# Patient Record
Sex: Male | Born: 1935 | Race: White | Hispanic: No | Marital: Married | Smoking: Never smoker
Health system: Southern US, Community
[De-identification: ages and names within clinical notes are randomized; demographics above are authoritative.]

## PROBLEM LIST (undated history)

## (undated) DIAGNOSIS — N400 Enlarged prostate without lower urinary tract symptoms: Secondary | ICD-10-CM

## (undated) DIAGNOSIS — E785 Hyperlipidemia, unspecified: Secondary | ICD-10-CM

## (undated) DIAGNOSIS — I480 Paroxysmal atrial fibrillation: Secondary | ICD-10-CM

## (undated) DIAGNOSIS — I00 Rheumatic fever without heart involvement: Secondary | ICD-10-CM

## (undated) DIAGNOSIS — M7989 Other specified soft tissue disorders: Secondary | ICD-10-CM

## (undated) DIAGNOSIS — I10 Essential (primary) hypertension: Secondary | ICD-10-CM

## (undated) DIAGNOSIS — E119 Type 2 diabetes mellitus without complications: Secondary | ICD-10-CM

## (undated) DIAGNOSIS — M1990 Unspecified osteoarthritis, unspecified site: Secondary | ICD-10-CM

## (undated) DIAGNOSIS — I739 Peripheral vascular disease, unspecified: Secondary | ICD-10-CM

## (undated) DIAGNOSIS — I272 Pulmonary hypertension, unspecified: Secondary | ICD-10-CM

## (undated) HISTORY — DX: Rheumatic fever without heart involvement: I00

## (undated) HISTORY — DX: Essential (primary) hypertension: I10

## (undated) HISTORY — DX: Unspecified osteoarthritis, unspecified site: M19.90

## (undated) HISTORY — DX: Type 2 diabetes mellitus without complications: E11.9

## (undated) HISTORY — PX: TENDON REPAIR: SHX5111

## (undated) HISTORY — PX: CATARACT EXTRACTION: SUR2

## (undated) HISTORY — PX: OTHER SURGICAL HISTORY: SHX169

## (undated) HISTORY — DX: Benign prostatic hyperplasia without lower urinary tract symptoms: N40.0

## (undated) HISTORY — DX: Hyperlipidemia, unspecified: E78.5

## (undated) HISTORY — DX: Pulmonary hypertension, unspecified: I27.20

## (undated) HISTORY — DX: Paroxysmal atrial fibrillation: I48.0

## (undated) HISTORY — DX: Other specified soft tissue disorders: M79.89

---

## 2004-05-28 ENCOUNTER — Ambulatory Visit: Payer: Self-pay | Admitting: Internal Medicine

## 2004-08-27 ENCOUNTER — Ambulatory Visit: Payer: Self-pay | Admitting: Internal Medicine

## 2004-12-31 ENCOUNTER — Ambulatory Visit: Payer: Self-pay | Admitting: Internal Medicine

## 2005-05-10 ENCOUNTER — Ambulatory Visit: Payer: Self-pay | Admitting: Internal Medicine

## 2005-07-09 ENCOUNTER — Ambulatory Visit: Payer: Self-pay | Admitting: Internal Medicine

## 2005-08-14 ENCOUNTER — Ambulatory Visit: Payer: Self-pay | Admitting: Internal Medicine

## 2005-11-18 ENCOUNTER — Ambulatory Visit: Payer: Self-pay | Admitting: Internal Medicine

## 2006-02-18 ENCOUNTER — Ambulatory Visit: Payer: Self-pay | Admitting: Internal Medicine

## 2006-06-20 ENCOUNTER — Ambulatory Visit: Payer: Self-pay | Admitting: Internal Medicine

## 2006-11-10 ENCOUNTER — Encounter: Payer: Self-pay | Admitting: Internal Medicine

## 2006-11-10 ENCOUNTER — Ambulatory Visit: Payer: Self-pay | Admitting: Internal Medicine

## 2006-11-10 DIAGNOSIS — E7800 Pure hypercholesterolemia, unspecified: Secondary | ICD-10-CM | POA: Insufficient documentation

## 2006-11-10 DIAGNOSIS — M1990 Unspecified osteoarthritis, unspecified site: Secondary | ICD-10-CM

## 2006-11-10 DIAGNOSIS — E785 Hyperlipidemia, unspecified: Secondary | ICD-10-CM

## 2006-11-10 DIAGNOSIS — E119 Type 2 diabetes mellitus without complications: Secondary | ICD-10-CM

## 2006-11-10 HISTORY — DX: Type 2 diabetes mellitus without complications: E11.9

## 2006-11-10 HISTORY — DX: Hyperlipidemia, unspecified: E78.5

## 2006-11-10 HISTORY — DX: Unspecified osteoarthritis, unspecified site: M19.90

## 2007-03-11 ENCOUNTER — Ambulatory Visit: Payer: Self-pay | Admitting: Internal Medicine

## 2007-06-18 ENCOUNTER — Ambulatory Visit: Payer: Self-pay | Admitting: Internal Medicine

## 2007-06-18 LAB — CONVERTED CEMR LAB: Cholesterol: 154 mg/dL (ref 0–200)

## 2007-11-24 ENCOUNTER — Encounter: Payer: Self-pay | Admitting: Internal Medicine

## 2007-11-25 ENCOUNTER — Ambulatory Visit: Payer: Self-pay | Admitting: Internal Medicine

## 2007-11-25 DIAGNOSIS — N400 Enlarged prostate without lower urinary tract symptoms: Secondary | ICD-10-CM

## 2007-11-25 HISTORY — DX: Benign prostatic hyperplasia without lower urinary tract symptoms: N40.0

## 2007-11-25 LAB — CONVERTED CEMR LAB
AST: 17 units/L (ref 0–37)
Albumin: 4 g/dL (ref 3.5–5.2)
Basophils Absolute: 0.1 10*3/uL (ref 0.0–0.1)
Basophils Relative: 0.9 % (ref 0.0–1.0)
Chloride: 107 meq/L (ref 96–112)
Cholesterol: 175 mg/dL (ref 0–200)
Creatinine, Ser: 1.1 mg/dL (ref 0.4–1.5)
Eosinophils Absolute: 0.3 10*3/uL (ref 0.0–0.7)
GFR calc Af Amer: 82 mL/min
GFR calc non Af Amer: 68 mL/min
HCT: 37.7 % — ABNORMAL LOW (ref 39.0–52.0)
HDL: 43.1 mg/dL (ref >39.0)
Hgb A1c MFr Bld: 7.2 % — ABNORMAL HIGH (ref 4.6–6.0)
MCHC: 34.5 g/dL (ref 30.0–36.0)
MCV: 91.7 fL (ref 78.0–100.0)
Monocytes Absolute: 0.5 10*3/uL (ref 0.1–1.0)
Neutrophils Relative %: 59.4 % (ref 43.0–77.0)
PSA: 1.23 ng/mL (ref 0.10–4.00)
Platelets: 242 10*3/uL (ref 150–400)
RBC: 4.1 M/uL — ABNORMAL LOW (ref 4.22–5.81)
TSH: 2.98 microintl units/mL (ref 0.35–5.50)
Total Bilirubin: 0.7 mg/dL (ref 0.3–1.2)
VLDL: 29 mg/dL (ref 0–40)

## 2007-12-14 ENCOUNTER — Telehealth: Payer: Self-pay | Admitting: Internal Medicine

## 2008-02-09 ENCOUNTER — Encounter: Payer: Self-pay | Admitting: Internal Medicine

## 2008-03-28 ENCOUNTER — Ambulatory Visit: Payer: Self-pay | Admitting: Internal Medicine

## 2008-03-28 LAB — CONVERTED CEMR LAB: Hgb A1c MFr Bld: 6.9 % — ABNORMAL HIGH (ref 4.6–6.0)

## 2008-06-30 ENCOUNTER — Ambulatory Visit: Payer: Self-pay | Admitting: Internal Medicine

## 2008-09-26 ENCOUNTER — Telehealth: Payer: Self-pay | Admitting: Internal Medicine

## 2008-12-12 ENCOUNTER — Encounter: Payer: Self-pay | Admitting: Internal Medicine

## 2008-12-13 ENCOUNTER — Ambulatory Visit: Payer: Self-pay | Admitting: Internal Medicine

## 2008-12-13 DIAGNOSIS — I10 Essential (primary) hypertension: Secondary | ICD-10-CM

## 2008-12-13 HISTORY — DX: Essential (primary) hypertension: I10

## 2008-12-13 LAB — CONVERTED CEMR LAB
ALT: 8 units/L (ref 0–53)
AST: 15 units/L (ref 0–37)
Albumin: 4 g/dL (ref 3.5–5.2)
Alkaline Phosphatase: 73 units/L (ref 39–117)
Eosinophils Relative: 3.8 % (ref 0.0–5.0)
GFR calc non Af Amer: 67.85 mL/min (ref >60)
HCT: 36.5 % — ABNORMAL LOW (ref 39.0–52.0)
Hemoglobin: 12.5 g/dL — ABNORMAL LOW (ref 13.0–17.0)
LDL Cholesterol: 87 mg/dL (ref 0–99)
Lymphocytes Relative: 20 % (ref 12.0–46.0)
Lymphs Abs: 1.2 10*3/uL (ref 0.7–4.0)
Monocytes Relative: 8.7 % (ref 3.0–12.0)
Neutro Abs: 4.2 10*3/uL (ref 1.4–7.7)
Potassium: 4.5 meq/L (ref 3.5–5.1)
Sodium: 144 meq/L (ref 135–145)
TSH: 2.63 microintl units/mL (ref 0.35–5.50)
Total Bilirubin: 0.8 mg/dL (ref 0.3–1.2)
Total CHOL/HDL Ratio: 4
VLDL: 23.2 mg/dL (ref 0.0–40.0)
WBC: 6.1 10*3/uL (ref 4.5–10.5)

## 2008-12-16 ENCOUNTER — Encounter: Payer: Self-pay | Admitting: Internal Medicine

## 2009-01-31 ENCOUNTER — Ambulatory Visit: Payer: Self-pay | Admitting: Internal Medicine

## 2009-03-27 ENCOUNTER — Telehealth: Payer: Self-pay | Admitting: Internal Medicine

## 2009-05-16 ENCOUNTER — Ambulatory Visit: Payer: Self-pay | Admitting: Internal Medicine

## 2009-09-14 ENCOUNTER — Ambulatory Visit: Payer: Self-pay | Admitting: Internal Medicine

## 2009-09-14 LAB — CONVERTED CEMR LAB: Hgb A1c MFr Bld: 6.9 % — ABNORMAL HIGH (ref 4.6–6.5)

## 2010-02-06 ENCOUNTER — Ambulatory Visit: Payer: Self-pay | Admitting: Internal Medicine

## 2010-06-06 ENCOUNTER — Encounter: Payer: Self-pay | Admitting: Internal Medicine

## 2010-06-06 ENCOUNTER — Other Ambulatory Visit: Payer: Self-pay | Admitting: Internal Medicine

## 2010-06-06 ENCOUNTER — Ambulatory Visit
Admission: RE | Admit: 2010-06-06 | Discharge: 2010-06-06 | Payer: Self-pay | Source: Home / Self Care | Attending: Internal Medicine | Admitting: Internal Medicine

## 2010-06-06 LAB — LIPID PANEL
LDL Cholesterol: 100 mg/dL — ABNORMAL HIGH (ref 0–99)
Total CHOL/HDL Ratio: 3
VLDL: 17.2 mg/dL (ref 0.0–40.0)

## 2010-06-06 LAB — CBC WITH DIFFERENTIAL/PLATELET
Basophils Relative: 0.4 % (ref 0.0–3.0)
Eosinophils Relative: 4 % (ref 0.0–5.0)
HCT: 36.5 % — ABNORMAL LOW (ref 39.0–52.0)
Hemoglobin: 12.3 g/dL — ABNORMAL LOW (ref 13.0–17.0)
Lymphs Abs: 1.3 10*3/uL (ref 0.7–4.0)
Monocytes Relative: 9.2 % (ref 3.0–12.0)
Neutro Abs: 3.9 10*3/uL (ref 1.4–7.7)
Platelets: 231 10*3/uL (ref 150.0–400.0)
RBC: 3.92 Mil/uL — ABNORMAL LOW (ref 4.22–5.81)
WBC: 6 10*3/uL (ref 4.5–10.5)

## 2010-06-06 LAB — BASIC METABOLIC PANEL
GFR: 54.31 mL/min — ABNORMAL LOW (ref >60.00)
Potassium: 4.7 mEq/L (ref 3.5–5.1)
Sodium: 142 mEq/L (ref 135–145)

## 2010-06-06 LAB — HM DIABETES FOOT EXAM

## 2010-06-06 LAB — TSH: TSH: 2.38 u[IU]/mL (ref 0.35–5.50)

## 2010-06-06 LAB — HEPATIC FUNCTION PANEL
ALT: 7 U/L (ref 0–53)
AST: 14 U/L (ref 0–37)
Total Bilirubin: 0.5 mg/dL (ref 0.3–1.2)

## 2010-06-10 LAB — CONVERTED CEMR LAB
ALT: 7 units/L (ref 0–53)
Alkaline Phosphatase: 63 units/L (ref 39–117)
BUN: 25 mg/dL — ABNORMAL HIGH (ref 6–23)
Basophils Relative: 0.7 % (ref 0.0–1.0)
CO2: 29 meq/L (ref 19–32)
Calcium: 9.4 mg/dL (ref 8.4–10.5)
Chloride: 108 meq/L (ref 96–112)
Cholesterol: 216 mg/dL (ref 0–200)
Creatinine, Ser: 1.1 mg/dL (ref 0.4–1.5)
Hemoglobin: 13.3 g/dL (ref 13.0–17.0)
Monocytes Absolute: 0.5 10*3/uL (ref 0.2–0.7)
Monocytes Relative: 8.8 % (ref 3.0–11.0)
Platelets: 242 10*3/uL (ref 150–400)
RDW: 13.1 % (ref 11.5–14.6)
Total Bilirubin: 0.6 mg/dL (ref 0.3–1.2)
Total CHOL/HDL Ratio: 5
Total Protein: 6.9 g/dL (ref 6.0–8.3)
VLDL: 27 mg/dL (ref 0–40)

## 2010-06-12 NOTE — Assessment & Plan Note (Signed)
Summary: 4 MONTH ROV/NJR pt wife rsc/njr/pts wife rsc/cjr   Vital Signs:  Patient profile:   77 year old male Weight:      203 pounds Temp:     98.2 degrees F oral BP sitting:   120 / 80  (left arm) Cuff size:   regular  Vitals Entered By: Duard Brady LPN (February 06, 2010 10:59 AM) CC: 4 mos rov - doing well    fbs 135 Is Patient Diabetic? Yes Did you bring your meter with you today? No   CC:  4 mos rov - doing well    fbs 135.  History of Present Illness: 77 year old patient who is seen today for follow-up of his type 2 diabetes.  He has treated hypertension and dyslipidemia.  He has done quite well and has maintained nice glycemic control.  His last hemoglobin A1c was 6.9.  Remains on Pravachol, which he tolerates well.  His blood pressure has been well controlled on combination therapy.  He denies any cardiopulmonary complaints.  Allergies (verified): No Known Drug Allergies  Past History:  Past Medical History: Reviewed history from 12/13/2008 and no changes required. Diabetes mellitus, type II Hyperlipidemia Osteoarthritis BPH Rheumatic fever, h/o Benign prostatic hypertrophy  Past Surgical History: Reviewed history from 12/13/2008 and no changes required. Mastoid surgery 1935 L foot tendon repair status post bilateral cataract surgery 2007 no colonoscopies  Review of Systems  The patient denies anorexia, fever, weight loss, weight gain, vision loss, decreased hearing, hoarseness, chest pain, syncope, dyspnea on exertion, peripheral edema, prolonged cough, headaches, hemoptysis, abdominal pain, melena, hematochezia, severe indigestion/heartburn, hematuria, incontinence, genital sores, muscle weakness, suspicious skin lesions, transient blindness, difficulty walking, depression, unusual weight change, abnormal bleeding, enlarged lymph nodes, angioedema, breast masses, and testicular masses.    Physical Exam  General:  Well-developed,well-nourished,in no  acute distress; alert,appropriate and cooperative throughout examination; blood pressure is 120/70 Head:  Normocephalic and atraumatic without obvious abnormalities. No apparent alopecia or balding. Eyes:  No corneal or conjunctival inflammation noted. EOMI. Perrla. Funduscopic exam benign, without hemorrhages, exudates or papilledema. Vision grossly normal. Mouth:  Oral mucosa and oropharynx without lesions or exudates.  Teeth in good repair. Neck:  No deformities, masses, or tenderness noted. Lungs:  Normal respiratory effort, chest expands symmetrically. Lungs are clear to auscultation, no crackles or wheezes. Heart:  Normal rate and regular rhythm. S1 and S2 normal without gallop, murmur, click, rub or other extra sounds. Msk:  No deformity or scoliosis noted of thoracic or lumbar spine.   Pulses:  R and L carotid,radial,femoral,dorsalis pedis and posterior tibial pulses are full and equal bilaterally Extremities:  No clubbing, cyanosis, edema, or deformity noted with normal full range of motion of all joints.     Impression & Recommendations:  Problem # 1:  HYPERTENSION (ICD-401.9)  His updated medication list for this problem includes:    Benazepril-hydrochlorothiazide 10-12.5 Mg Tabs (Benazepril-hydrochlorothiazide) ..... One by mouth daily  His updated medication list for this problem includes:    Benazepril-hydrochlorothiazide 10-12.5 Mg Tabs (Benazepril-hydrochlorothiazide) ..... One by mouth daily  Problem # 2:  DIABETES MELLITUS, TYPE II (ICD-250.00)  His updated medication list for this problem includes:    Aspirin 325 Mg Tabs (Aspirin) .Marland Kitchen... Take 1 tablet by mouth once a day    Metformin Hcl 1000 Mg Tabs (Metformin hcl) .Marland Kitchen... Take 1 tablet by mouth twice a day    Benazepril-hydrochlorothiazide 10-12.5 Mg Tabs (Benazepril-hydrochlorothiazide) ..... One by mouth daily will check a hemoglobin A1c today  His updated medication list for this problem includes:    Aspirin  325 Mg Tabs (Aspirin) .Marland Kitchen... Take 1 tablet by mouth once a day    Metformin Hcl 1000 Mg Tabs (Metformin hcl) .Marland Kitchen... Take 1 tablet by mouth twice a day    Benazepril-hydrochlorothiazide 10-12.5 Mg Tabs (Benazepril-hydrochlorothiazide) ..... One by mouth daily  Orders: Venipuncture (40347) TLB-A1C / Hgb A1C (Glycohemoglobin) (83036-A1C) Specimen Handling (42595)  Problem # 3:  HYPERLIPIDEMIA (ICD-272.4)  His updated medication list for this problem includes:    Pravachol 20 Mg Tabs (Pravastatin sodium) .Marland Kitchen... Take 1 tablet by mouth every night  His updated medication list for this problem includes:    Pravachol 20 Mg Tabs (Pravastatin sodium) .Marland Kitchen... Take 1 tablet by mouth every night  Complete Medication List: 1)  Aspirin 325 Mg Tabs (Aspirin) .... Take 1 tablet by mouth once a day 2)  Metformin Hcl 1000 Mg Tabs (Metformin hcl) .... Take 1 tablet by mouth twice a day 3)  Pravachol 20 Mg Tabs (Pravastatin sodium) .... Take 1 tablet by mouth every night 4)  Hydrocodone-acetaminophen 5-500 Mg Tabs (Hydrocodone-acetaminophen) .... One every six hours as needed pain 5)  Precision Xtra Blood Glucose Strp (Glucose blood) .... Use two times a day 6)  Accu-chek Compact Strp (Glucose blood) .... Use daily 7)  Accu-chek Softclix Lancets Misc (Lancets) .... Use daily 8)  Benazepril-hydrochlorothiazide 10-12.5 Mg Tabs (Benazepril-hydrochlorothiazide) .... One by mouth daily  Patient Instructions: 1)  Please schedule a follow-up appointment in 4 months. 2)  Advised not to eat any food or drink any liquids after 10 PM the night before your procedure. 3)  Limit your Sodium (Salt). 4)  It is important that you exercise regularly at least 20 minutes 5 times a week. If you develop chest pain, have severe difficulty breathing, or feel very tired , stop exercising immediately and seek medical attention. 5)  Check your blood sugars regularly. If your readings are usually above : or below 70 you should contact  our office. 6)  It is important that your Diabetic A1c level is checked every 3 months. 7)  See your eye doctor yearly to check for diabetic eye damage.

## 2010-06-12 NOTE — Assessment & Plan Note (Signed)
Summary: 3 MO ROV/MM   Vital Signs:  Patient profile:   77 year old male Weight:      202 pounds Temp:     98.5 degrees F oral Pulse rate:   88 / minute Pulse rhythm:   irregular BP sitting:   162 / 88  (left arm) Cuff size:   regular  Vitals Entered By: Raechel Ache, RN (May 16, 2009 11:12 AM) CC: 3 mo ROV, c/o "headcold"  BS 127.   CC:  3 mo ROV and c/o "headcold"  BS 127.Marland Kitchen  History of Present Illness:  77 year old patient seen today for follow-up.  He has type 2 diabetes, hypertension, and dyslipidemia.  He also is a history of osteoarthritis and BPH.  He has done quite well except for some mild URI symptoms.  No fever.  Denies any chest pain or shortness of breath.  Blood sugars have been under much control.  His last hemoglobin A1c7.0.  Allergies: No Known Drug Allergies  Past History:  Past Medical History: Reviewed history from 12/13/2008 and no changes required. Diabetes mellitus, type II Hyperlipidemia Osteoarthritis BPH Rheumatic fever, h/o Benign prostatic hypertrophy  Past Surgical History: Reviewed history from 12/13/2008 and no changes required. Mastoid surgery 1935 L foot tendon repair status post bilateral cataract surgery 2007 no colonoscopies  Review of Systems  The patient denies anorexia, fever, weight loss, weight gain, vision loss, decreased hearing, hoarseness, chest pain, syncope, dyspnea on exertion, peripheral edema, prolonged cough, headaches, hemoptysis, abdominal pain, melena, hematochezia, severe indigestion/heartburn, hematuria, incontinence, genital sores, muscle weakness, suspicious skin lesions, transient blindness, difficulty walking, depression, unusual weight change, abnormal bleeding, enlarged lymph nodes, angioedema, breast masses, and testicular masses.    Physical Exam  General:  Well-developed,well-nourished,in no acute distress; alert,appropriate and cooperative throughout examination Head:  Normocephalic and  atraumatic without obvious abnormalities. No apparent alopecia or balding. Eyes:  No corneal or conjunctival inflammation noted. EOMI. Perrla. Funduscopic exam benign, without hemorrhages, exudates or papilledema. Vision grossly normal. Mouth:  Oral mucosa and oropharynx without lesions or exudates.  Teeth in good repair. Neck:  No deformities, masses, or tenderness noted. Lungs:  few bibasilar rales Heart:  Normal rate and regular rhythm. S1 and S2 normal without gallop, murmur, click, rub or other extra sounds. Abdomen:  Bowel sounds positive,abdomen soft and non-tender without masses, organomegaly or hernias noted. Msk:  No deformity or scoliosis noted of thoracic or lumbar spine.   Extremities:  trace left pedal edema and trace right pedal edema.    Diabetes Management Exam:    Eye Exam:       Eye Exam done here today          Results: normal   Impression & Recommendations:  Problem # 1:  HYPERTENSION (ICD-401.9)  His updated medication list for this problem includes:    Benazepril-hydrochlorothiazide 10-12.5 Mg Tabs (Benazepril-hydrochlorothiazide) ..... One by mouth daily  His updated medication list for this problem includes:    Benazepril-hydrochlorothiazide 10-12.5 Mg Tabs (Benazepril-hydrochlorothiazide) ..... One by mouth daily  Problem # 2:  HYPERLIPIDEMIA (ICD-272.4)  His updated medication list for this problem includes:    Pravachol 20 Mg Tabs (Pravastatin sodium) .Marland Kitchen... Take 1 tablet by mouth every night  His updated medication list for this problem includes:    Pravachol 20 Mg Tabs (Pravastatin sodium) .Marland Kitchen... Take 1 tablet by mouth every night  Problem # 3:  DIABETES MELLITUS, TYPE II (ICD-250.00)  His updated medication list for this problem includes:  Aspirin 325 Mg Tabs (Aspirin) .Marland Kitchen... Take 1 tablet by mouth once a day    Metformin Hcl 1000 Mg Tabs (Metformin hcl) .Marland Kitchen... Take 1 tablet by mouth twice a day    Benazepril-hydrochlorothiazide 10-12.5 Mg Tabs  (Benazepril-hydrochlorothiazide) ..... One by mouth daily    His updated medication list for this problem includes:    Aspirin 325 Mg Tabs (Aspirin) .Marland Kitchen... Take 1 tablet by mouth once a day    Metformin Hcl 1000 Mg Tabs (Metformin hcl) .Marland Kitchen... Take 1 tablet by mouth twice a day    Benazepril-hydrochlorothiazide 10-12.5 Mg Tabs (Benazepril-hydrochlorothiazide) ..... One by mouth daily  Orders: Venipuncture (24401) TLB-A1C / Hgb A1C (Glycohemoglobin) (83036-A1C)  Complete Medication List: 1)  Aspirin 325 Mg Tabs (Aspirin) .... Take 1 tablet by mouth once a day 2)  Metformin Hcl 1000 Mg Tabs (Metformin hcl) .... Take 1 tablet by mouth twice a day 3)  Pravachol 20 Mg Tabs (Pravastatin sodium) .... Take 1 tablet by mouth every night 4)  Hydrocodone-acetaminophen 5-500 Mg Tabs (Hydrocodone-acetaminophen) .... One every six hours as needed pain 5)  Precision Xtra Blood Glucose Strp (Glucose blood) .... Use two times a day 6)  Accu-chek Compact Strp (Glucose blood) .... Use daily 7)  Accu-chek Softclix Lancets Misc (Lancets) .... Use daily 8)  Benazepril-hydrochlorothiazide 10-12.5 Mg Tabs (Benazepril-hydrochlorothiazide) .... One by mouth daily  Patient Instructions: 1)  Please schedule a follow-up appointment in 4 months. 2)  Limit your Sodium (Salt). 3)  It is important that you exercise regularly at least 20 minutes 5 times a week. If you develop chest pain, have severe difficulty breathing, or feel very tired , stop exercising immediately and seek medical attention. 4)  Check your blood sugars regularly. If your readings are usually above : or below 70 you should contact our office. 5)  It is important that your Diabetic A1c level is checked every 3 months. 6)  See your eye doctor yearly to check for diabetic eye damage.

## 2010-06-12 NOTE — Assessment & Plan Note (Signed)
Summary: 4 mo rov/mm   Vital Signs:  Patient profile:   77 year old male Weight:      203 pounds Temp:     98.1 degrees F oral BP sitting:   140 / 70  (left arm) Cuff size:   regular  Vitals Entered By: Duard Brady LPN (Sep 14, 1189 10:07 AM) CC: 4 mos rov - doing well     fbs 125 Is Patient Diabetic? Yes Did you bring your meter with you today? No   CC:  4 mos rov - doing well     fbs 125.  History of Present Illness: 77 year old patient who is in today for follow-up.  he has a history of type 2 diabetes, which has been stable.  His last hemoglobin A1c7.0.  Her fasting blood sugar this morning, 125.  He states his blood sugars throughout the day are usually in the 130 range.  He has treated hypertension, which has been stable, as well as dyslipidemia.  Medical regimen includes Pravachol, which he continues to tolerate well.  He has mild BPH. Clinically, he has done quite well.  Two weeks ago.  He had a URI which has resolved  Preventive Screening-Counseling & Management  Alcohol-Tobacco     Smoking Status: quit  Allergies (verified): No Known Drug Allergies  Past History:  Past Medical History: Reviewed history from 12/13/2008 and no changes required. Diabetes mellitus, type II Hyperlipidemia Osteoarthritis BPH Rheumatic fever, h/o Benign prostatic hypertrophy  Social History: Reviewed history from 11/25/2007 and no changes required. 7 children 13 grandchildren Married  Review of Systems       The patient complains of difficulty walking.  The patient denies anorexia, fever, weight loss, weight gain, vision loss, decreased hearing, hoarseness, chest pain, syncope, dyspnea on exertion, peripheral edema, prolonged cough, headaches, hemoptysis, abdominal pain, melena, hematochezia, severe indigestion/heartburn, hematuria, incontinence, genital sores, muscle weakness, suspicious skin lesions, transient blindness, depression, unusual weight change, abnormal  bleeding, enlarged lymph nodes, angioedema, breast masses, and testicular masses.         recent eye exam  Physical Exam  General:  Well-developed,well-nourished,in no acute distress; alert,appropriate and cooperative throughout examination Head:  Normocephalic and atraumatic without obvious abnormalities. No apparent alopecia or balding. Eyes:  No corneal or conjunctival inflammation noted. EOMI. Perrla. Funduscopic exam benign, without hemorrhages, exudates or papilledema. Vision grossly normal. Mouth:  Oral mucosa and oropharynx without lesions or exudates.   Neck:  No deformities, masses, or tenderness noted. Chest Wall:  No deformities, masses, tenderness or gynecomastia noted. Lungs:  a few crackles at the right base Heart:  Normal rate and regular rhythm. S1 and S2 normal without gallop, murmur, click, rub or other extra sounds. Abdomen:  Bowel sounds positive,abdomen soft and non-tender without masses, organomegaly or hernias noted. Msk:  No deformity or scoliosis noted of thoracic or lumbar spine.   Extremities:  trace left pedal edema and trace right pedal edema.  trace left pedal edema and trace right pedal edema.   Skin:  Intact without suspicious lesions or rashes Cervical Nodes:  No lymphadenopathy noted Psych:  Cognition and judgment appear intact. Alert and cooperative with normal attention span and concentration. No apparent delusions, illusions, hallucinations   Impression & Recommendations:  Problem # 1:  HYPERTENSION (ICD-401.9)  His updated medication list for this problem includes:    Benazepril-hydrochlorothiazide 10-12.5 Mg Tabs (Benazepril-hydrochlorothiazide) ..... One by mouth daily  His updated medication list for this problem includes:    Benazepril-hydrochlorothiazide 10-12.5 Mg  Tabs (Benazepril-hydrochlorothiazide) ..... One by mouth daily  Problem # 2:  HYPERLIPIDEMIA (ICD-272.4)  His updated medication list for this problem includes:    Pravachol 20  Mg Tabs (Pravastatin sodium) .Marland Kitchen... Take 1 tablet by mouth every night    His updated medication list for this problem includes:    Pravachol 20 Mg Tabs (Pravastatin sodium) .Marland Kitchen... Take 1 tablet by mouth every night  Problem # 3:  DIABETES MELLITUS, TYPE II (ICD-250.00)  His updated medication list for this problem includes:    Aspirin 325 Mg Tabs (Aspirin) .Marland Kitchen... Take 1 tablet by mouth once a day    Metformin Hcl 1000 Mg Tabs (Metformin hcl) .Marland Kitchen... Take 1 tablet by mouth twice a day    Benazepril-hydrochlorothiazide 10-12.5 Mg Tabs (Benazepril-hydrochlorothiazide) ..... One by mouth daily    His updated medication list for this problem includes:    Aspirin 325 Mg Tabs (Aspirin) .Marland Kitchen... Take 1 tablet by mouth once a day    Metformin Hcl 1000 Mg Tabs (Metformin hcl) .Marland Kitchen... Take 1 tablet by mouth twice a day    Benazepril-hydrochlorothiazide 10-12.5 Mg Tabs (Benazepril-hydrochlorothiazide) ..... One by mouth daily  Complete Medication List: 1)  Aspirin 325 Mg Tabs (Aspirin) .... Take 1 tablet by mouth once a day 2)  Metformin Hcl 1000 Mg Tabs (Metformin hcl) .... Take 1 tablet by mouth twice a day 3)  Pravachol 20 Mg Tabs (Pravastatin sodium) .... Take 1 tablet by mouth every night 4)  Hydrocodone-acetaminophen 5-500 Mg Tabs (Hydrocodone-acetaminophen) .... One every six hours as needed pain 5)  Precision Xtra Blood Glucose Strp (Glucose blood) .... Use two times a day 6)  Accu-chek Compact Strp (Glucose blood) .... Use daily 7)  Accu-chek Softclix Lancets Misc (Lancets) .... Use daily 8)  Benazepril-hydrochlorothiazide 10-12.5 Mg Tabs (Benazepril-hydrochlorothiazide) .... One by mouth daily  Other Orders: Venipuncture (95284) TLB-A1C / Hgb A1C (Glycohemoglobin) (83036-A1C) Prescription Created Electronically [id number]  Patient Instructions: 1)  Please schedule a follow-up appointment in 4 months. 2)  Limit your Sodium (Salt). 3)  It is important that you exercise regularly at least 20  minutes 5 times a week. If you develop chest pain, have severe difficulty breathing, or feel very tired , stop exercising immediately and seek medical attention. 4)  Check your blood sugars regularly. If your readings are usually above : or below 70 you should contact our office. 5)  It is important that your Diabetic A1c level is checked every 3 months. 6)  See your eye doctor yearly to check for diabetic eye damage. Prescriptions: HYDROCODONE-ACETAMINOPHEN 5-500 MG  TABS (HYDROCODONE-ACETAMINOPHEN) one every six hours as needed pain  #50 x 3   Entered and Authorized by:   Gordy Savers  MD   Signed by:   Gordy Savers  MD on 09/14/2009   Method used:   Print then Give to Patient   RxID:   [id number] BENAZEPRIL-HYDROCHLOROTHIAZIDE 10-12.5 MG TABS (BENAZEPRIL-HYDROCHLOROTHIAZIDE) one by mouth daily  #90 x 3   Entered and Authorized by:   Gordy Savers  MD   Signed by:   Gordy Savers  MD on 09/14/2009   Method used:   Print then Give to Patient   RxID:   [id number] ACCU-CHEK SOFTCLIX LANCETS  MISC (LANCETS) use daily  #90 x 6   Entered and Authorized by:   Gordy Savers  MD   Signed by:   Gordy Savers  MD on 09/14/2009   Method used:   Print then Give  to Patient   RxID:   [id number] ACCU-CHEK COMPACT  STRP (GLUCOSE BLOOD) use daily  #90 x 6   Entered and Authorized by:   Gordy Savers  MD   Signed by:   Gordy Savers  MD on 09/14/2009   Method used:   Print then Give to Patient   RxID:   [id number] PRECISION XTRA BLOOD GLUCOSE   STRP (GLUCOSE BLOOD) use two times a day  #120 x 6   Entered and Authorized by:   Gordy Savers  MD   Signed by:   Gordy Savers  MD on 09/14/2009   Method used:   Print then Give to Patient   RxID:   [id number] PRAVACHOL 20 MG TABS (PRAVASTATIN SODIUM) Take 1 tablet by mouth every night  #90 x 6   Entered and Authorized by:   Gordy Savers  MD   Signed by:   Gordy Savers  MD on 09/14/2009   Method used:   Print then Give to Patient   RxID:   [id number] METFORMIN HCL 1000 MG TABS (METFORMIN HCL) Take 1 tablet by mouth twice a day  #180 x 6   Entered and Authorized by:   Gordy Savers  MD   Signed by:   Gordy Savers  MD on 09/14/2009   Method used:   Print then Give to Patient   RxID:   [id number] BENAZEPRIL-HYDROCHLOROTHIAZIDE 10-12.5 MG TABS (BENAZEPRIL-HYDROCHLOROTHIAZIDE) one by mouth daily  #90 x 3   Entered and Authorized by:   Gordy Savers  MD   Signed by:   Gordy Savers  MD on 09/14/2009   Method used:   Electronically to        [facility] [address] (retail)       [address]       [place], [place]  [address]       Ph: [phone]       Fax: [phone]   RxID:   [id number] ACCU-CHEK SOFTCLIX LANCETS  MISC (LANCETS) use daily  #90 x 6   Entered and Authorized by:   Gordy Savers  MD   Signed by:   Gordy Savers  MD on 09/14/2009   Method used:   Electronically to        [facility] [address] (retail)       [address]       [place], [place]  [address]       Ph: [phone]       Fax: [phone]   RxID:   [id number] ACCU-CHEK COMPACT  STRP (GLUCOSE BLOOD) use daily  #90 x 6   Entered and Authorized by:   Gordy Savers  MD   Signed by:   Gordy Savers  MD on 09/14/2009   Method used:   Electronically to        [facility] [address] (retail)       [address]       [place], [place]  [address]       Ph: [phone]       Fax: [phone]   RxID:   [id number] PRECISION XTRA BLOOD GLUCOSE   STRP (GLUCOSE BLOOD) use two times a day  #120 x 6   Entered and Authorized by:  Gordy Savers  MD   Signed by:   Gordy Savers  MD on 09/14/2009   Method used:   Electronically to        [facility] [address] (retail)       [address]       [place], [place]  [address]       Ph: [phone]       Fax: [phone]   RxID:   [id number] PRAVACHOL 20 MG TABS (PRAVASTATIN SODIUM) Take 1 tablet by mouth every night  #90 x 6   Entered and Authorized by:   Gordy Savers  MD   Signed by:   Gordy Savers  MD on 09/14/2009   Method used:   Electronically to        [facility] [address] (retail)       [address]       [place], [place]  [address]       Ph: [phone]       Fax: [phone]   RxID:   [id number] METFORMIN HCL 1000 MG TABS (METFORMIN HCL) Take 1 tablet by mouth twice a day  #180 x 6   Entered and Authorized by:   Gordy Savers  MD   Signed by:   Gordy Savers  MD on 09/14/2009   Method used:   Electronically to        [facility] [address] (retail)       [address]       [place], [place]  [address]       Ph: [phone]       Fax: [phone]   RxID:   [id number]

## 2010-06-14 NOTE — Assessment & Plan Note (Signed)
Summary: emp/pt coming in fasting/cjr   Vital Signs:  Patient profile:   77 year old male Height:      70 inches Weight:      203 pounds BMI:     29.23 O2 Sat:      93 % on Room air Temp:     98.0 degrees F oral Pulse rate:   87 / minute BP sitting:   110 / 80  (right arm) Cuff size:   regular  Vitals Entered By: Duard Brady LPN (June 06, 2010 9:09 AM)  O2 Flow:  Room air CC: cpx - doing ok    fbs 129 Is Patient Diabetic? Yes Did you bring your meter with you today? No   CC:  cpx - doing ok    fbs 129.  History of Present Illness: an 77 year old patient who is seen today for a comprehensive annual evaluation.  He has a history of type 2 diabetes dyslipidemia, as well as treated hypertension.  He has done remarkably well.  He does have some osteoarthritis and a history of BPH.  Here for Medicare AWV:  1.   Risk factors based on Past M, S, F history: cardiovascular risk factors include type 2 diabetes, hypertension, dyslipidemia 2.   Physical Activities: fairly active considering his age, without activity restrictions 3.   Depression/mood: no history of depression or mood disorder 4.   Hearing: wears a hearing aid bilaterally 5.   ADL's: independent in all aspects of daily living 6.   Fall Risk: mild/moderate digitus age 34.   Home Safety: no problems identified 8.   Height, weight, &visual acuity:height and weight stable.  No change in visual acuity has had recent eye surgery 9.   Counseling: heart healthy diet more regular exercise.  All encouraged 10.   Labs ordered based on risk factors: laboratory profile, including lipid panel, and hemoglobin A1c will be reviewed 11.           Referral Coordination- and appropriate at this time 12.           Care Plan- heart healthy diet.  Encouraged will continue to track serial hemoglobin A1c s and continue home blood sugar monitoring 13.            Cognitive Assessment- alert and oriented, with normal affect.  No history of  cognitive dysfunction.   Preventive Screening-Counseling & Management  Alcohol-Tobacco     Smoking Status: quit     Year Quit: 1980  Allergies (verified): No Known Drug Allergies  Past History:  Past Medical History: Reviewed history from 12/13/2008 and no changes required. Diabetes mellitus, type II Hyperlipidemia Osteoarthritis BPH Rheumatic fever, h/o Benign prostatic hypertrophy  Past Surgical History: Reviewed history from 12/13/2008 and no changes required. Mastoid surgery 1935 L foot tendon repair status post bilateral cataract surgery 2007 no colonoscopies  Family History: Reviewed history from 12/13/2008 and no changes required. Family History Lung cancer: died at 74 (father) Family History of Cardiovascular disorder:  mother died CHF 85's Father died lung Ca- 34's 2 B: both deceased unclear reasons 3 sister: MS;  2 deceased   Social History: Reviewed history from 11/25/2007 and no changes required. 7 children 13 grandchildren Married  Review of Systems  The patient denies anorexia, fever, weight loss, weight gain, vision loss, decreased hearing, hoarseness, chest pain, syncope, dyspnea on exertion, peripheral edema, prolonged cough, headaches, hemoptysis, abdominal pain, melena, hematochezia, severe indigestion/heartburn, hematuria, incontinence, genital sores, muscle weakness, suspicious skin lesions, transient blindness, difficulty  walking, depression, unusual weight change, abnormal bleeding, enlarged lymph nodes, angioedema, breast masses, and testicular masses.    Physical Exam  General:  elderly alert, in no distress.  Blood pressure 120/76 Head:  Normocephalic and atraumatic without obvious abnormalities. No apparent alopecia or balding. Eyes:  No corneal or conjunctival inflammation noted. EOMI. Perrla. Funduscopic exam benign, without hemorrhages, exudates or papilledema. Vision grossly normal. Ears:  External ear exam shows no significant  lesions or deformities.  Otoscopic examination reveals clear canals, tympanic membranes are intact bilaterally without bulging, retraction, inflammation or discharge. Hearing is grossly normal bilaterally. Mouth:  Oral mucosa and oropharynx without lesions or exudates.  dentures in place Neck:  No deformities, masses, or tenderness noted. Chest Wall:  No deformities, masses, tenderness or gynecomastia noted. Breasts:  No masses or gynecomastia noted Lungs:  a few bibasilar crackles Heart:  Normal rate and regular rhythm. S1 and S2 normal without gallop, murmur, click, rub or other extra sounds. Abdomen:  Bowel sounds positive,abdomen soft and non-tender without masses, organomegaly or hernias noted. Rectal:  No external abnormalities noted. Normal sphincter tone. No rectal masses or tenderness. Genitalia:  unremarkable except for atrophy Prostate:  2+ enlarged.  2+ enlarged.   Msk:  No deformity or scoliosis noted of thoracic or lumbar spine.   Pulses:  pedal pulse is not easily palpable Extremities:  trace left pedal edema and trace right pedal edema.  trace left pedal edema.   Neurologic:  alert & oriented X3, cranial nerves II-XII intact, strength normal in all extremities, gait normal, and heel-to-shin normal.  alert & oriented X3, cranial nerves II-XII intact, strength normal in all extremities, gait normal, and heel-to-shin normal.   Skin:  Intact without suspicious lesions or rashes Cervical Nodes:  No lymphadenopathy noted Axillary Nodes:  No palpable lymphadenopathy Inguinal Nodes:  No significant adenopathy Psych:  Cognition and judgment appear intact. Alert and cooperative with normal attention span and concentration. No apparent delusions, illusions, hallucinations  Diabetes Management Exam:    Foot Exam (with socks and/or shoes not present):       Sensory-Pinprick/Light touch:          Left medial foot (L-4): normal          Left dorsal foot (L-5): normal          Left lateral  foot (S-1): normal          Right medial foot (L-4): normal          Right dorsal foot (L-5): normal          Right lateral foot (S-1): normal       Sensory-Monofilament:          Left foot: normal          Right foot: normal       Inspection:          Left foot: normal          Right foot: normal       Nails:          Left foot: normal          Right foot: normal    Foot Exam by Podiatrist:       Date: 06/06/2010       Results: no diabetic findings       Done by: PCP    Eye Exam:       Eye Exam done here today          Results: normal  Impression & Recommendations:  Problem # 1:  PREVENTIVE HEALTH CARE (ICD-V70.0)  Orders: Medicare -1st Annual Wellness Visit [phone])  Complete Medication List: 1)  Aspirin 325 Mg Tabs (Aspirin) .... Take 1 tablet by mouth once a day 2)  Metformin Hcl 1000 Mg Tabs (Metformin hcl) .... Take 1 tablet by mouth twice a day 3)  Pravachol 20 Mg Tabs (Pravastatin sodium) .... Take 1 tablet by mouth every night 4)  Hydrocodone-acetaminophen 5-500 Mg Tabs (Hydrocodone-acetaminophen) .... One every six hours as needed pain 5)  Precision Xtra Blood Glucose Strp (Glucose blood) .... Use two times a day 6)  Accu-chek Compact Strp (Glucose blood) .... Use daily 7)  Accu-chek Softclix Lancets Misc (Lancets) .... Use daily 8)  Benazepril-hydrochlorothiazide 10-12.5 Mg Tabs (Benazepril-hydrochlorothiazide) .... One by mouth daily  Other Orders: EKG w/ Interpretation (93000) Venipuncture (60454) TLB-Lipid Panel (80061-LIPID) TLB-BMP (Basic Metabolic Panel-BMET) (80048-METABOL) TLB-CBC Platelet - w/Differential (85025-CBCD) TLB-Hepatic/Liver Function Pnl (80076-HEPATIC) TLB-TSH (Thyroid Stimulating Hormone) (84443-TSH) TLB-A1C / Hgb A1C (Glycohemoglobin) (83036-A1C) Specimen Handling (09811)  Patient Instructions: 1)  Please schedule a follow-up appointment in 4 months. 2)  Limit your Sodium (Salt). 3)  It is important that you exercise regularly at  least 20 minutes 5 times a week. If you develop chest pain, have severe difficulty breathing, or feel very tired , stop exercising immediately and seek medical attention. 4)  You need to lose weight. Consider a lower calorie diet and regular exercise.  5)  Check your blood sugars regularly. If your readings are usually above : or below 70 you should contact our office. 6)  It is important that your Diabetic A1c level is checked every 3 months. Prescriptions: BENAZEPRIL-HYDROCHLOROTHIAZIDE 10-12.5 MG TABS (BENAZEPRIL-HYDROCHLOROTHIAZIDE) one by mouth daily  #90 x 3   Entered and Authorized by:   Gordy Savers  MD   Signed by:   Gordy Savers  MD on 06/06/2010   Method used:   Print then Give to Patient   RxID:   [id number] ACCU-CHEK SOFTCLIX LANCETS  MISC (LANCETS) use daily  #90 x 6   Entered and Authorized by:   Gordy Savers  MD   Signed by:   Gordy Savers  MD on 06/06/2010   Method used:   Print then Give to Patient   RxID:   [id number] ACCU-CHEK COMPACT  STRP (GLUCOSE BLOOD) use daily  #90 x 6   Entered and Authorized by:   Gordy Savers  MD   Signed by:   Gordy Savers  MD on 06/06/2010   Method used:   Print then Give to Patient   RxID:   [id number] PRECISION XTRA BLOOD GLUCOSE   STRP (GLUCOSE BLOOD) use two times a day  #120 x 6   Entered and Authorized by:   Gordy Savers  MD   Signed by:   Gordy Savers  MD on 06/06/2010   Method used:   Print then Give to Patient   RxID:   [id number] HYDROCODONE-ACETAMINOPHEN 5-500 MG  TABS (HYDROCODONE-ACETAMINOPHEN) one every six hours as needed pain  #50 x 3   Entered and Authorized by:   Gordy Savers  MD   Signed by:   Gordy Savers  MD on 06/06/2010   Method used:   Print then Give to Patient   RxID:   [id number] PRAVACHOL 20 MG TABS (PRAVASTATIN SODIUM) Take 1 tablet by mouth every night  #90 x 6   Entered and Authorized by:  Gordy Savers   MD   Signed by:   Gordy Savers  MD on 06/06/2010   Method used:   Print then Give to Patient   RxID:   [id number] METFORMIN HCL 1000 MG TABS (METFORMIN HCL) Take 1 tablet by mouth twice a day  #180 x 6   Entered and Authorized by:   Gordy Savers  MD   Signed by:   Gordy Savers  MD on 06/06/2010   Method used:   Print then Give to Patient   RxID:   [id number]    Orders Added: 1)  EKG w/ Interpretation [93000] 2)  Medicare -1st Annual Wellness Visit [G0438] 3)  Venipuncture [84696] 4)  TLB-Lipid Panel [80061-LIPID] 5)  TLB-BMP (Basic Metabolic Panel-BMET) [80048-METABOL] 6)  TLB-CBC Platelet - w/Differential [85025-CBCD] 7)  TLB-Hepatic/Liver Function Pnl [80076-HEPATIC] 8)  TLB-TSH (Thyroid Stimulating Hormone) [84443-TSH] 9)  TLB-A1C / Hgb A1C (Glycohemoglobin) [83036-A1C] 10)  Specimen Handling [99000]

## 2010-09-26 ENCOUNTER — Encounter: Payer: Self-pay | Admitting: Nurse Practitioner

## 2010-09-26 ENCOUNTER — Encounter: Payer: Self-pay | Admitting: Cardiothoracic Surgery

## 2010-10-03 ENCOUNTER — Ambulatory Visit: Payer: Self-pay | Admitting: Internal Medicine

## 2010-10-12 ENCOUNTER — Encounter: Payer: Self-pay | Admitting: Nurse Practitioner

## 2010-10-12 ENCOUNTER — Encounter: Payer: Self-pay | Admitting: Cardiothoracic Surgery

## 2010-10-17 ENCOUNTER — Ambulatory Visit: Payer: Self-pay | Admitting: Internal Medicine

## 2010-10-26 ENCOUNTER — Ambulatory Visit: Payer: Self-pay | Admitting: Vascular Surgery

## 2010-11-11 ENCOUNTER — Encounter: Payer: Self-pay | Admitting: Cardiothoracic Surgery

## 2010-11-27 ENCOUNTER — Other Ambulatory Visit: Payer: Self-pay

## 2010-11-27 MED ORDER — BENAZEPRIL-HYDROCHLOROTHIAZIDE 10-12.5 MG PO TABS: 1.0000 | ORAL_TABLET | Freq: Every day | ORAL | Status: DC

## 2010-11-27 NOTE — Telephone Encounter (Signed)
[facility]ill request [facility] - faxed back . kik

## 2010-12-12 ENCOUNTER — Encounter: Payer: Self-pay | Admitting: Cardiothoracic Surgery

## 2011-01-12 ENCOUNTER — Encounter: Payer: Self-pay | Admitting: Cardiothoracic Surgery

## 2011-01-21 ENCOUNTER — Encounter: Payer: Self-pay | Admitting: Nurse Practitioner

## 2011-02-11 ENCOUNTER — Encounter: Payer: Self-pay | Admitting: Nurse Practitioner

## 2011-02-11 ENCOUNTER — Encounter: Payer: Self-pay | Admitting: Cardiothoracic Surgery

## 2011-03-14 ENCOUNTER — Encounter: Payer: Self-pay | Admitting: Cardiothoracic Surgery

## 2011-03-14 ENCOUNTER — Encounter: Payer: Self-pay | Admitting: Nurse Practitioner

## 2011-04-13 ENCOUNTER — Encounter: Payer: Self-pay | Admitting: Nurse Practitioner

## 2011-04-13 ENCOUNTER — Encounter: Payer: Self-pay | Admitting: Cardiothoracic Surgery

## 2011-05-14 ENCOUNTER — Encounter: Payer: Self-pay | Admitting: Nurse Practitioner

## 2011-05-14 ENCOUNTER — Encounter: Payer: Self-pay | Admitting: Cardiothoracic Surgery

## 2011-07-05 ENCOUNTER — Encounter: Payer: Self-pay | Admitting: Internal Medicine

## 2011-07-09 ENCOUNTER — Encounter: Payer: Self-pay | Admitting: Internal Medicine

## 2011-07-30 ENCOUNTER — Ambulatory Visit (INDEPENDENT_AMBULATORY_CARE_PROVIDER_SITE_OTHER): Payer: 59 | Admitting: Internal Medicine

## 2011-07-30 ENCOUNTER — Encounter: Payer: Self-pay | Admitting: Internal Medicine

## 2011-07-30 VITALS — BP 120/86 | HR 88 | Temp 98.2°F | Resp 20 | Ht 70.0 in | Wt 199.0 lb

## 2011-07-30 DIAGNOSIS — E785 Hyperlipidemia, unspecified: Secondary | ICD-10-CM

## 2011-07-30 DIAGNOSIS — N400 Enlarged prostate without lower urinary tract symptoms: Secondary | ICD-10-CM

## 2011-07-30 DIAGNOSIS — Z0000 Encounter for general adult medical examination without abnormal findings: Secondary | ICD-10-CM

## 2011-07-30 DIAGNOSIS — I10 Essential (primary) hypertension: Secondary | ICD-10-CM

## 2011-07-30 DIAGNOSIS — E119 Type 2 diabetes mellitus without complications: Secondary | ICD-10-CM

## 2011-07-30 DIAGNOSIS — I739 Peripheral vascular disease, unspecified: Secondary | ICD-10-CM

## 2011-07-30 LAB — CBC WITH DIFFERENTIAL/PLATELET
Basophils Absolute: 0 10*3/uL (ref 0.0–0.1)
Eosinophils Absolute: 0.3 10*3/uL (ref 0.0–0.7)
HCT: 36 % — ABNORMAL LOW (ref 39.0–52.0)
Hemoglobin: 11.8 g/dL — ABNORMAL LOW (ref 13.0–17.0)
Lymphocytes Relative: 19.8 % (ref 12.0–46.0)
Lymphs Abs: 1.1 10*3/uL (ref 0.7–4.0)
MCHC: 32.7 g/dL (ref 30.0–36.0)
Neutro Abs: 3.7 10*3/uL (ref 1.4–7.7)
RDW: 14.6 % (ref 11.5–14.6)

## 2011-07-30 LAB — HEMOGLOBIN A1C: Hgb A1c MFr Bld: 6.9 % — ABNORMAL HIGH (ref 4.6–6.5)

## 2011-07-30 MED ORDER — BENAZEPRIL-HYDROCHLOROTHIAZIDE 10-12.5 MG PO TABS: 1.0000 | ORAL_TABLET | Freq: Every day | ORAL | Status: DC

## 2011-07-30 MED ORDER — GLUCOSE BLOOD VI STRP: ORAL_STRIP | Status: DC

## 2011-07-30 MED ORDER — PRAVASTATIN SODIUM 20 MG PO TABS: 20.0000 mg | ORAL_TABLET | Freq: Every day | ORAL | Status: DC

## 2011-07-30 MED ORDER — METFORMIN HCL 1000 MG PO TABS: 1000.0000 mg | ORAL_TABLET | Freq: Two times a day (BID) | ORAL | Status: DC

## 2011-07-30 NOTE — Patient Instructions (Signed)
Limit your sodium (Salt) intake   Please check your hemoglobin A1c every 3 months   

## 2011-07-30 NOTE — Progress Notes (Signed)
Subjective:    Patient ID: Darryl Estimable., male    DOB: 11/18/1923, 78 y.o.   MRN: [id number]  HPI  78 year old patient who has a history of hypertension type 2 diabetes and dyslipidemia who has not been seen here in over one year. He had vascular surgery performed with stenting of left leg in June of last year. He had a nonhealing wound involving the medial aspect of his left ankle. He has done quite well without concerns or complaints.   Here for Medicare AWV:   1. Risk factors based on Past M, S, F history: cardiovascular risk factors include type 2 diabetes, hypertension, dyslipidemia  2. Physical Activities: fairly active considering his age, without activity restrictions  3. Depression/mood: no history of depression or mood disorder  4. Hearing: wears a hearing aid bilaterally  5. ADL's: independent in all aspects of daily living  6. Fall Risk: mild/moderate digitus age  78. Home Safety: no problems identified  8. Height, weight, &visual acuity:height and weight stable. No change in visual acuity has had recent eye surgery  9. Counseling: heart healthy diet more regular exercise. All encouraged  10. Labs ordered based on risk factors: laboratory profile, including lipid panel, and hemoglobin A1c will be reviewed  11. Referral Coordination- and appropriate at this time  12. Care Plan- heart healthy diet. Encouraged will continue to track serial hemoglobin A1c s and continue home blood sugar monitoring  13. Cognitive Assessment- alert and oriented, with normal affect. No history of cognitive dysfunction.   Preventive Screening-Counseling & Management  Alcohol-Tobacco  Smoking Status: quit  Year Quit: 1980   Allergies (verified):  No Known Drug Allergies  Past History:  Past Medical History:  Reviewed history from 12/13/2008 and no changes required.  Diabetes mellitus, type II  Hyperlipidemia  Osteoarthritis  BPH  Rheumatic fever, h/o  Benign prostatic hypertrophy    Past Surgical History:  Reviewed history from 12/13/2008 and no changes required.  Mastoid surgery 1935  L foot tendon repair  status post bilateral cataract surgery 2007  no colonoscopies  Stent in the left femoral artery June 2012  Family History:  Reviewed history from 12/13/2008 and no changes required.  Family History Lung cancer: died at 47 (father)  Family History of Cardiovascular disorder:  mother died CHF 64's  Father died lung Ca- 39's  2 B: both deceased unclear reasons  3 sister: MS; 2 deceased   Social History:  Reviewed history from 11/25/2007 and no changes required.  7 children 13 grandchildren  Married  Past Medical History  Diagnosis Date  . BENIGN PROSTATIC HYPERTROPHY 11/25/2007  . DIABETES MELLITUS, TYPE II 11/10/2006  . HYPERLIPIDEMIA 11/10/2006  . HYPERTENSION 12/13/2008  . OSTEOARTHRITIS 11/10/2006  . Rheumatic fever     History   Social History  . Marital Status: Married    Spouse Name: N/A    Number of Children: N/A  . Years of Education: N/A   Occupational History  . Not on file.   Social History Main Topics  . Smoking status: Never Smoker   . Smokeless tobacco: Never Used  . Alcohol Use: Yes  . Drug Use: No  . Sexually Active: Not on file   Other Topics Concern  . Not on file   Social History Narrative  . No narrative on file    Past Surgical History  Procedure Date  . Mastoid surgery   . Tendon repair     left foot  . Cataract extraction  Family History  Problem Relation Age of Onset  . Heart disease Mother   . Cancer Father     lung ca    No Known Allergies  Current Outpatient Prescriptions on File Prior to Visit  Medication Sig Dispense Refill  . ACCU-CHEK SOFTCLIX LANCETS lancets by Other route daily. Use as instructed      . aspirin 325 MG tablet Take 325 mg by mouth daily.      . benazepril-hydrochlorthiazide (LOTENSIN HCT) 10-12.5 MG per tablet Take 1 tablet by mouth daily.  90 tablet  3  . glucose  blood (ACCU-CHEK COMPACT STRIPS) test strip 1 each by Other route as needed. Use as instructed      . HYDROcodone-acetaminophen (VICODIN) 5-500 MG per tablet Take 1 tablet by mouth every 6 (six) hours as needed.      . metFORMIN (GLUCOPHAGE) 1000 MG tablet Take 1,000 mg by mouth 2 (two) times daily with a meal.      . pravastatin (PRAVACHOL) 20 MG tablet Take 20 mg by mouth daily.        BP 120/86  Pulse 88  Temp(Src) 98.2 F (36.8 C) (Oral)  Resp 20  Ht 5\' 10"  (1.778 m)  Wt 199 lb (90.266 kg)  BMI 28.55 kg/m2  SpO2 96%       Review of Systems  Constitutional: Negative for fever, chills, appetite change and fatigue.  HENT: Negative for hearing loss, ear pain, congestion, sore throat, trouble swallowing, neck stiffness, dental problem, voice change and tinnitus.   Eyes: Negative for pain, discharge and visual disturbance.  Respiratory: Negative for cough, chest tightness, wheezing and stridor.   Cardiovascular: Negative for chest pain, palpitations and leg swelling.  Gastrointestinal: Negative for nausea, vomiting, abdominal pain, diarrhea, constipation, blood in stool and abdominal distention.  Genitourinary: Negative for urgency, hematuria, flank pain, discharge, difficulty urinating and genital sores.  Musculoskeletal: Negative for myalgias, back pain, joint swelling, arthralgias and gait problem.  Skin: Negative for rash.  Neurological: Negative for dizziness, syncope, speech difficulty, weakness, numbness and headaches.  Hematological: Negative for adenopathy. Does not bruise/bleed easily.  Psychiatric/Behavioral: Negative for behavioral problems and dysphoric mood. The patient is not nervous/anxious.        Objective:   Physical Exam  Constitutional: He appears well-developed and well-nourished.       Blood pressure 140/80 bilaterally  HENT:  Head: Normocephalic and atraumatic.  Right Ear: External ear normal.  Left Ear: External ear normal.  Nose: Nose normal.   Mouth/Throat: Oropharynx is clear and moist.  Eyes: Conjunctivae and EOM are normal. Pupils are equal, round, and reactive to light. No scleral icterus.  Neck: Normal range of motion. Neck supple. No JVD present. No thyromegaly present.  Cardiovascular: Regular rhythm, normal heart sounds and intact distal pulses.  Exam reveals no gallop and no friction rub.   No murmur heard. Pulmonary/Chest: Effort normal and breath sounds normal. He exhibits no tenderness.  Abdominal: Soft. Bowel sounds are normal. He exhibits no distension and no mass. There is no tenderness.  Genitourinary: Prostate normal and penis normal.  Musculoskeletal: Normal range of motion. He exhibits no edema and no tenderness.        Compression hose noted involving both the lower extremities. No significant edema. Pedal pulses nonpalpable through the compression hose  Lymphadenopathy:    He has no cervical adenopathy.  Neurological: He is alert. He has normal reflexes. No cranial nerve deficit. Coordination normal.  Skin: Skin is warm and dry. No rash  noted.  Psychiatric: He has a normal mood and affect. His behavior is normal.          Assessment & Plan:   Preventive health examination Type 2 diabetes. We'll check a hemoglobin A1c Dyslipidemia Hypertension PA D.  Recheck 3 months

## 2011-07-31 LAB — COMPREHENSIVE METABOLIC PANEL
ALT: 6 U/L (ref 0–53)
AST: 16 U/L (ref 0–37)
Calcium: 9 mg/dL (ref 8.4–10.5)
Chloride: 102 mEq/L (ref 96–112)
Creatinine, Ser: 1.1 mg/dL (ref 0.4–1.5)
Sodium: 138 mEq/L (ref 135–145)

## 2011-07-31 LAB — LIPID PANEL
HDL: 57.3 mg/dL (ref >39.00)
Total CHOL/HDL Ratio: 3

## 2011-08-26 ENCOUNTER — Other Ambulatory Visit: Payer: Self-pay | Admitting: Internal Medicine

## 2011-10-30 ENCOUNTER — Encounter: Payer: Self-pay | Admitting: Internal Medicine

## 2011-10-30 ENCOUNTER — Ambulatory Visit (INDEPENDENT_AMBULATORY_CARE_PROVIDER_SITE_OTHER): Payer: 59 | Admitting: Internal Medicine

## 2011-10-30 VITALS — BP 140/80 | Temp 98.3°F | Wt 198.0 lb

## 2011-10-30 DIAGNOSIS — E785 Hyperlipidemia, unspecified: Secondary | ICD-10-CM

## 2011-10-30 DIAGNOSIS — I10 Essential (primary) hypertension: Secondary | ICD-10-CM

## 2011-10-30 DIAGNOSIS — I739 Peripheral vascular disease, unspecified: Secondary | ICD-10-CM

## 2011-10-30 DIAGNOSIS — M1990 Unspecified osteoarthritis, unspecified site: Secondary | ICD-10-CM

## 2011-10-30 DIAGNOSIS — E119 Type 2 diabetes mellitus without complications: Secondary | ICD-10-CM

## 2011-10-30 LAB — HEMOGLOBIN A1C: Hgb A1c MFr Bld: 6.9 % — ABNORMAL HIGH (ref 4.6–6.5)

## 2011-10-30 MED ORDER — BENAZEPRIL-HYDROCHLOROTHIAZIDE 10-12.5 MG PO TABS: 1.0000 | ORAL_TABLET | Freq: Every day | ORAL | Status: DC

## 2011-10-30 MED ORDER — PRAVASTATIN SODIUM 20 MG PO TABS: 20.0000 mg | ORAL_TABLET | Freq: Every day | ORAL | Status: DC

## 2011-10-30 MED ORDER — METFORMIN HCL 1000 MG PO TABS: 1000.0000 mg | ORAL_TABLET | Freq: Two times a day (BID) | ORAL | Status: DC

## 2011-10-30 NOTE — Progress Notes (Signed)
  Subjective:    Patient ID: Darryl Estimable., male    DOB: November 12, 1923, 78 y.o.   MRN: [id number]  HPI  78 year old patient who is seen today for followup of hypertension dyslipidemia type 2 diabetes and peripheral vascular disease. He is status post left leg stenting in June of last year denies any claudication. He does have significant osteoarthritis which interferes with ambulation. Application for disability parking placard completed Blood sugars remained stable. Denies any cardiopulmonary complaints. Remains on Pravachol 20 mg daily    Review of Systems  Constitutional: Negative for fever, chills, appetite change and fatigue.  HENT: Negative for hearing loss, ear pain, congestion, sore throat, trouble swallowing, neck stiffness, dental problem, voice change and tinnitus.   Eyes: Negative for pain, discharge and visual disturbance.  Respiratory: Negative for cough, chest tightness, wheezing and stridor.   Cardiovascular: Negative for chest pain, palpitations and leg swelling.  Gastrointestinal: Negative for nausea, vomiting, abdominal pain, diarrhea, constipation, blood in stool and abdominal distention.  Genitourinary: Negative for urgency, hematuria, flank pain, discharge, difficulty urinating and genital sores.  Musculoskeletal: Positive for joint swelling, arthralgias and gait problem. Negative for myalgias and back pain.  Skin: Negative for rash.  Neurological: Negative for dizziness, syncope, speech difficulty, weakness, numbness and headaches.  Hematological: Negative for adenopathy. Does not bruise/bleed easily.  Psychiatric/Behavioral: Negative for behavioral problems and dysphoric mood. The patient is not nervous/anxious.        Objective:   Physical Exam  Constitutional: He is oriented to person, place, and time. He appears well-developed.  HENT:  Head: Normocephalic.  Right Ear: External ear normal.  Left Ear: External ear normal.  Eyes: Conjunctivae and EOM are  normal.  Neck: Normal range of motion.  Cardiovascular: Normal rate and normal heart sounds.   Pulmonary/Chest: Breath sounds normal.  Abdominal: Bowel sounds are normal.  Musculoskeletal: Normal range of motion. He exhibits edema. He exhibits no tenderness.       Slight left ankle swelling  Neurological: He is alert and oriented to person, place, and time.  Psychiatric: He has a normal mood and affect. His behavior is normal.          Assessment & Plan:   Hypertension. Blood pressure well controlled Diabetes mellitus. We'll check a hemoglobin A1c. Continue present regimen Dyslipidemia. Continue pravastatin 20 Peripheral vascular disease/osteoarthritis- handicap placard completed

## 2011-10-30 NOTE — Patient Instructions (Signed)
Limit your sodium (Salt) intake   Please check your hemoglobin A1c every 3 months  Please check your blood pressure on a regular basis.  If it is consistently greater than 150/90, please make an office appointment.   

## 2011-11-13 ENCOUNTER — Telehealth: Payer: Self-pay | Admitting: Internal Medicine

## 2011-11-13 MED ORDER — BENAZEPRIL-HYDROCHLOROTHIAZIDE 10-12.5 MG PO TABS: 1.0000 | ORAL_TABLET | Freq: Every day | ORAL | Status: DC

## 2011-11-13 NOTE — Telephone Encounter (Signed)
Pt states that [facility] says pt has no refills left on benazepril-hydrochlorthiazide (LOTENSIN HCT) 10-12.5 MG per tablet. rx was last filled on  10/30/2011 with 3 refills. Pt was told to contact office

## 2011-11-13 NOTE — Telephone Encounter (Signed)
done

## 2012-01-30 ENCOUNTER — Ambulatory Visit (INDEPENDENT_AMBULATORY_CARE_PROVIDER_SITE_OTHER): Payer: 59 | Admitting: Internal Medicine

## 2012-01-30 ENCOUNTER — Encounter: Payer: Self-pay | Admitting: Internal Medicine

## 2012-01-30 VITALS — BP 130/70 | Temp 97.7°F | Wt 195.0 lb

## 2012-01-30 DIAGNOSIS — E785 Hyperlipidemia, unspecified: Secondary | ICD-10-CM

## 2012-01-30 DIAGNOSIS — I739 Peripheral vascular disease, unspecified: Secondary | ICD-10-CM

## 2012-01-30 DIAGNOSIS — I10 Essential (primary) hypertension: Secondary | ICD-10-CM

## 2012-01-30 DIAGNOSIS — E119 Type 2 diabetes mellitus without complications: Secondary | ICD-10-CM

## 2012-01-30 LAB — HEMOGLOBIN A1C: Hgb A1c MFr Bld: 6.8 % — ABNORMAL HIGH (ref 4.6–6.5)

## 2012-01-30 NOTE — Patient Instructions (Signed)
Limit your sodium (Salt) intake   Please check your hemoglobin A1c every 3 months   

## 2012-01-30 NOTE — Progress Notes (Signed)
Subjective:    Patient ID: Darryl Estimable., male    DOB: [birth year], 78 y.o.   MRN: [id number]  HPI  78 year old patient who is seen today for followup of type 2 diabetes hypertension and dyslipidemia. He has a history of peripheral vascular occlusive disease which has been stable. He has osteoarthritis. In general doing quite well.  Lab Results  Component Value Date   HGBA1C 6.9* 10/30/2011    BP Readings from Last 3 Encounters:  01/30/12 130/70  10/30/11 140/80  07/30/11 120/86    Past Medical History  Diagnosis Date  . BENIGN PROSTATIC HYPERTROPHY 11/25/2007  . DIABETES MELLITUS, TYPE II 11/10/2006  . HYPERLIPIDEMIA 11/10/2006  . HYPERTENSION 12/13/2008  . OSTEOARTHRITIS 11/10/2006  . Rheumatic fever     History   Social History  . Marital Status: Married    Spouse Name: N/A    Number of Children: N/A  . Years of Education: N/A   Occupational History  . Not on file.   Social History Main Topics  . Smoking status: Never Smoker   . Smokeless tobacco: Never Used  . Alcohol Use: Yes  . Drug Use: No  . Sexually Active: Not on file   Other Topics Concern  . Not on file   Social History Narrative  . No narrative on file    Past Surgical History  Procedure Date  . Mastoid surgery   . Tendon repair     left foot  . Cataract extraction     Family History  Problem Relation Age of Onset  . Heart disease Mother   . Cancer Father     lung ca    No Known Allergies  Current Outpatient Prescriptions on File Prior to Visit  Medication Sig Dispense Refill  . ACCU-CHEK SOFTCLIX LANCETS lancets by Other route daily. Use as instructed      . aspirin 325 MG tablet Take 325 mg by mouth daily.      . benazepril-hydrochlorthiazide (LOTENSIN HCT) 10-12.5 MG per tablet Take 1 tablet by mouth daily.  90 tablet  3  . glucose blood (ACCU-CHEK COMPACT STRIPS) test strip Use daily  100 each  6  . HYDROcodone-acetaminophen (VICODIN) 5-500 MG per tablet Take 1 tablet by mouth  every 6 (six) hours as needed.      . metFORMIN (GLUCOPHAGE) 1000 MG tablet Take 1 tablet (1,000 mg total) by mouth 2 (two) times daily with a meal.  180 tablet  6  . pravastatin (PRAVACHOL) 20 MG tablet Take 1 tablet (20 mg total) by mouth daily.  90 tablet  5  . TRAVATAN Z 0.004 % SOLN ophthalmic solution         BP 130/70  Temp 97.7 F (36.5 C) (Oral)  Wt 195 lb (88.451 kg)      Review of Systems  Constitutional: Negative for fever, chills, appetite change and fatigue.  HENT: Negative for hearing loss, ear pain, congestion, sore throat, trouble swallowing, neck stiffness, dental problem, voice change and tinnitus.   Eyes: Negative for pain, discharge and visual disturbance.  Respiratory: Negative for cough, chest tightness, wheezing and stridor.   Cardiovascular: Positive for leg swelling. Negative for chest pain and palpitations.  Gastrointestinal: Negative for nausea, vomiting, abdominal pain, diarrhea, constipation, blood in stool and abdominal distention.  Genitourinary: Negative for urgency, hematuria, flank pain, discharge, difficulty urinating and genital sores.  Musculoskeletal: Negative for myalgias, back pain, joint swelling, arthralgias and gait problem.  Skin: Negative for rash.  Neurological: Negative for  dizziness, syncope, speech difficulty, weakness, numbness and headaches.  Hematological: Negative for adenopathy. Does not bruise/bleed easily.  Psychiatric/Behavioral: Negative for behavioral problems and dysphoric mood. The patient is not nervous/anxious.        Objective:   Physical Exam  Constitutional: He is oriented to person, place, and time. He appears well-developed.  HENT:  Head: Normocephalic.  Right Ear: External ear normal.  Left Ear: External ear normal.  Eyes: Conjunctivae normal and EOM are normal.  Neck: Normal range of motion.  Cardiovascular: Normal rate and normal heart sounds.   Pulmonary/Chest: Breath sounds normal.  Abdominal: Bowel  sounds are normal.  Musculoskeletal: Normal range of motion. He exhibits edema. He exhibits no tenderness.       TED stockings in place  Neurological: He is alert and oriented to person, place, and time.  Psychiatric: He has a normal mood and affect. His behavior is normal.          Assessment & Plan:  Hypertension well controlled Dyslipidemia. Continue pravastatin Diabetes mellitus type 2. Will check a hemoglobin A1c PAD. Remains asymptomatic  Recheck 3 months

## 2012-05-25 ENCOUNTER — Encounter: Payer: Self-pay | Admitting: Internal Medicine

## 2012-05-25 ENCOUNTER — Ambulatory Visit (INDEPENDENT_AMBULATORY_CARE_PROVIDER_SITE_OTHER): Payer: 59 | Admitting: Internal Medicine

## 2012-05-25 VITALS — BP 140/80 | HR 86 | Temp 98.0°F | Resp 18 | Wt 196.0 lb

## 2012-05-25 DIAGNOSIS — M1990 Unspecified osteoarthritis, unspecified site: Secondary | ICD-10-CM

## 2012-05-25 DIAGNOSIS — I10 Essential (primary) hypertension: Secondary | ICD-10-CM

## 2012-05-25 DIAGNOSIS — E119 Type 2 diabetes mellitus without complications: Secondary | ICD-10-CM

## 2012-05-25 DIAGNOSIS — I739 Peripheral vascular disease, unspecified: Secondary | ICD-10-CM

## 2012-05-25 DIAGNOSIS — E785 Hyperlipidemia, unspecified: Secondary | ICD-10-CM

## 2012-05-25 NOTE — Patient Instructions (Signed)
Please check your hemoglobin A1c every 3 months  Limit your sodium (Salt) intake   

## 2012-05-25 NOTE — Progress Notes (Signed)
Subjective:    Patient ID: Darryl Estimable., male    DOB: Sep 23, 1923, 79 y.o.   MRN: [id number]  HPI  79 year old patient who is seen today for followup of type 2 diabetes. He has a history of hypertension dyslipidemia osteoarthritis and peripheral vascular disease. For the past few months he has done quite well. He is scheduled for an eye examination later this month. No cardiopulmonary complaints. He does have some occasional peripheral edema.  Past Medical History  Diagnosis Date  . BENIGN PROSTATIC HYPERTROPHY 11/25/2007  . DIABETES MELLITUS, TYPE II 11/10/2006  . HYPERLIPIDEMIA 11/10/2006  . HYPERTENSION 12/13/2008  . OSTEOARTHRITIS 11/10/2006  . Rheumatic fever     History   Social History  . Marital Status: Married    Spouse Name: N/A    Number of Children: N/A  . Years of Education: N/A   Occupational History  . Not on file.   Social History Main Topics  . Smoking status: Never Smoker   . Smokeless tobacco: Never Used  . Alcohol Use: Yes  . Drug Use: No  . Sexually Active: Not on file   Other Topics Concern  . Not on file   Social History Narrative  . No narrative on file    Past Surgical History  Procedure Date  . Mastoid surgery   . Tendon repair     left foot  . Cataract extraction     Family History  Problem Relation Age of Onset  . Heart disease Mother   . Cancer Father     lung ca    No Known Allergies  Current Outpatient Prescriptions on File Prior to Visit  Medication Sig Dispense Refill  . ACCU-CHEK SOFTCLIX LANCETS lancets by Other route daily. Use as instructed      . aspirin 325 MG tablet Take 325 mg by mouth daily.      . benazepril-hydrochlorthiazide (LOTENSIN HCT) 10-12.5 MG per tablet Take 1 tablet by mouth daily.  90 tablet  3  . glucose blood (ACCU-CHEK COMPACT STRIPS) test strip Use daily  100 each  6  . metFORMIN (GLUCOPHAGE) 1000 MG tablet Take 1 tablet (1,000 mg total) by mouth 2 (two) times daily with a meal.  180 tablet  6    . pravastatin (PRAVACHOL) 20 MG tablet Take 1 tablet (20 mg total) by mouth daily.  90 tablet  5  . TRAVATAN Z 0.004 % SOLN ophthalmic solution         BP 140/80  Pulse 86  Temp 98 F (36.7 C) (Oral)  Resp 18  Wt 196 lb (88.905 kg)  SpO2 95%       Review of Systems  Constitutional: Negative for fever, chills, appetite change and fatigue.  HENT: Negative for hearing loss, ear pain, congestion, sore throat, trouble swallowing, neck stiffness, dental problem, voice change and tinnitus.   Eyes: Negative for pain, discharge and visual disturbance.  Respiratory: Negative for cough, chest tightness, wheezing and stridor.   Cardiovascular: Positive for leg swelling. Negative for chest pain and palpitations.  Gastrointestinal: Negative for nausea, vomiting, abdominal pain, diarrhea, constipation, blood in stool and abdominal distention.  Genitourinary: Negative for urgency, hematuria, flank pain, discharge, difficulty urinating and genital sores.  Musculoskeletal: Negative for myalgias, back pain, joint swelling, arthralgias and gait problem.  Skin: Negative for rash.  Neurological: Negative for dizziness, syncope, speech difficulty, weakness, numbness and headaches.  Hematological: Negative for adenopathy. Does not bruise/bleed easily.  Psychiatric/Behavioral: Negative for behavioral problems and dysphoric mood.  The patient is not nervous/anxious.        Objective:   Physical Exam  Constitutional: He is oriented to person, place, and time. He appears well-developed.  HENT:  Head: Normocephalic.  Right Ear: External ear normal.  Left Ear: External ear normal.  Eyes: Conjunctivae normal and EOM are normal.  Neck: Normal range of motion.  Cardiovascular: Normal rate and normal heart sounds.   Pulmonary/Chest: He has rales.       Bibasilar rales O2 saturation 95%  Abdominal: Bowel sounds are normal.  Musculoskeletal: Normal range of motion. He exhibits edema. He exhibits no  tenderness.  Neurological: He is alert and oriented to person, place, and time.  Psychiatric: He has a normal mood and affect. His behavior is normal.          Assessment & Plan:   Diabetes mellitus. Will check a hemoglobin A1c Hypertension stable Osteoarthritis Dyslipidemia  Will check a hemoglobin A1c Low-salt diet recommended Recheck 3 months

## 2012-08-24 ENCOUNTER — Encounter: Payer: 59 | Admitting: Internal Medicine

## 2012-08-24 ENCOUNTER — Ambulatory Visit: Payer: 59 | Admitting: Internal Medicine

## 2012-09-18 ENCOUNTER — Encounter: Payer: 59 | Admitting: Internal Medicine

## 2012-11-19 ENCOUNTER — Ambulatory Visit (INDEPENDENT_AMBULATORY_CARE_PROVIDER_SITE_OTHER): Payer: 59 | Admitting: Internal Medicine

## 2012-11-19 ENCOUNTER — Encounter: Payer: Self-pay | Admitting: Internal Medicine

## 2012-11-19 ENCOUNTER — Telehealth: Payer: Self-pay | Admitting: Internal Medicine

## 2012-11-19 VITALS — BP 122/70 | HR 96 | Temp 98.3°F | Resp 20 | Ht 69.0 in | Wt 194.0 lb

## 2012-11-19 DIAGNOSIS — I10 Essential (primary) hypertension: Secondary | ICD-10-CM

## 2012-11-19 DIAGNOSIS — E119 Type 2 diabetes mellitus without complications: Secondary | ICD-10-CM

## 2012-11-19 DIAGNOSIS — M1990 Unspecified osteoarthritis, unspecified site: Secondary | ICD-10-CM

## 2012-11-19 DIAGNOSIS — I739 Peripheral vascular disease, unspecified: Secondary | ICD-10-CM

## 2012-11-19 DIAGNOSIS — Z0000 Encounter for general adult medical examination without abnormal findings: Secondary | ICD-10-CM

## 2012-11-19 DIAGNOSIS — E785 Hyperlipidemia, unspecified: Secondary | ICD-10-CM

## 2012-11-19 LAB — COMPREHENSIVE METABOLIC PANEL
ALT: 8 U/L (ref 0–53)
AST: 15 U/L (ref 0–37)
Alkaline Phosphatase: 67 U/L (ref 39–117)
BUN: 27 mg/dL — ABNORMAL HIGH (ref 6–23)
Calcium: 9.3 mg/dL (ref 8.4–10.5)
Chloride: 101 mEq/L (ref 96–112)
Creatinine, Ser: 1.4 mg/dL (ref 0.4–1.5)
Total Bilirubin: 0.4 mg/dL (ref 0.3–1.2)

## 2012-11-19 LAB — CBC WITH DIFFERENTIAL/PLATELET
Basophils Absolute: 0 10*3/uL (ref 0.0–0.1)
Basophils Relative: 0.3 % (ref 0.0–3.0)
Eosinophils Absolute: 0.3 10*3/uL (ref 0.0–0.7)
MCHC: 33 g/dL (ref 30.0–36.0)
MCV: 94.5 fl (ref 78.0–100.0)
Monocytes Absolute: 0.6 10*3/uL (ref 0.1–1.0)
Neutro Abs: 4.7 10*3/uL (ref 1.4–7.7)
Neutrophils Relative %: 66 % (ref 43.0–77.0)
RBC: 3.76 Mil/uL — ABNORMAL LOW (ref 4.22–5.81)
RDW: 14.1 % (ref 11.5–14.6)

## 2012-11-19 LAB — LIPID PANEL
Cholesterol: 171 mg/dL (ref 0–200)
LDL Cholesterol: 89 mg/dL (ref 0–99)
Total CHOL/HDL Ratio: 4
Triglycerides: 171 mg/dL — ABNORMAL HIGH (ref 0.0–149.0)
VLDL: 34.2 mg/dL (ref 0.0–40.0)

## 2012-11-19 LAB — MICROALBUMIN / CREATININE URINE RATIO: Creatinine,U: 90.8 mg/dL

## 2012-11-19 LAB — HEMOGLOBIN A1C: Hgb A1c MFr Bld: 7 % — ABNORMAL HIGH (ref 4.6–6.5)

## 2012-11-19 MED ORDER — GLUCOSE BLOOD VI STRP: 1.0000 | ORAL_STRIP | Freq: Every day | Status: DC | PRN

## 2012-11-19 MED ORDER — PRAVASTATIN SODIUM 20 MG PO TABS: 20.0000 mg | ORAL_TABLET | Freq: Every day | ORAL | Status: DC

## 2012-11-19 MED ORDER — METFORMIN HCL 1000 MG PO TABS: 1000.0000 mg | ORAL_TABLET | Freq: Two times a day (BID) | ORAL | Status: DC

## 2012-11-19 MED ORDER — BENAZEPRIL-HYDROCHLOROTHIAZIDE 10-12.5 MG PO TABS: 1.0000 | ORAL_TABLET | Freq: Every day | ORAL | Status: DC

## 2012-11-19 NOTE — Telephone Encounter (Signed)
Left detailed message Rx's were sent to pharmacy's as requested.

## 2012-11-19 NOTE — Telephone Encounter (Signed)
Pt just wants to confirm that MD has refilled his RX's for the next year.  benazepril-hydrochlorthiazide (LOTENSIN HCT) 10-12.  1/day [facility] mailorder  90 day w/ 3 refills  These 2 go to different pharm metFORMIN (GLUCOPHAGE) 1000 MG tablet/  1 BID pravastatin (PRAVACHOL) 20 MG tablet 1/day Pharm: [facility], [place], [place]

## 2012-11-19 NOTE — Progress Notes (Signed)
Patient ID: Darryl Estimable., male   DOB: 1924-04-18, 79 y.o.   MRN: [id number]  Subjective:    Patient ID: Darryl Estimable., male    DOB: July 26, 1923, 79 y.o.   MRN: [id number]  HPI  58 -year-old patient who has a history of hypertension type 2 diabetes and dyslipidemia who is seen today for an annual exam. He had vascular surgery performed with stenting of left leg in June of 2012. He had a nonhealing wound involving the medial aspect of his left ankle. He has done quite well without concerns or complaints.   Here for Medicare AWV:   1. Risk factors based on Past M, S, F history: cardiovascular risk factors include type 2 diabetes, hypertension, dyslipidemia  2. Physical Activities: fairly active considering his age, without activity restrictions  3. Depression/mood: no history of depression or mood disorder  4. Hearing: wears a hearing aid bilaterally  5. ADL's: independent in all aspects of daily living  6. Fall Risk: mild/moderate digitus age  37. Home Safety: no problems identified  8. Height, weight, &visual acuity:height and weight stable. No change in visual acuity has had recent eye surgery  9. Counseling: heart healthy diet more regular exercise. All encouraged  10. Labs ordered based on risk factors: laboratory profile, including lipid panel, and hemoglobin A1c will be reviewed  11. Referral Coordination- and appropriate at this time  12. Care Plan- heart healthy diet. Encouraged will continue to track serial hemoglobin A1c s and continue home blood sugar monitoring  13. Cognitive Assessment- alert and oriented, with normal affect. No history of cognitive dysfunction.   Preventive Screening-Counseling & Management  Alcohol-Tobacco  Smoking Status: quit  Year Quit: 1980   Allergies (verified):  No Known Drug Allergies  Past History:  Past Medical History:  Reviewed history from 12/13/2008 and no changes required.  Diabetes mellitus, type II  Hyperlipidemia   Osteoarthritis  BPH  Rheumatic fever, h/o  Benign prostatic hypertrophy   Past Surgical History:  Reviewed history from 12/13/2008 and no changes required.  Mastoid surgery 1935  L foot tendon repair  status post bilateral cataract surgery 2007  no colonoscopies  Stent in the left femoral artery June 2012  Family History:  Reviewed history from 12/13/2008 and no changes required.  Family History Lung cancer: died at 66 (father)  Family History of Cardiovascular disorder:  mother died CHF 72's  Father died lung Ca- 69's  2 B: both deceased unclear reasons  3 sister: MS; 2 deceased   Social History:  Reviewed history from 11/25/2007 and no changes required.  7 children 13 grandchildren  Married  Past Medical History  Diagnosis Date  . BENIGN PROSTATIC HYPERTROPHY 11/25/2007  . DIABETES MELLITUS, TYPE II 11/10/2006  . HYPERLIPIDEMIA 11/10/2006  . HYPERTENSION 12/13/2008  . OSTEOARTHRITIS 11/10/2006  . Rheumatic fever     History   Social History  . Marital Status: Married    Spouse Name: N/A    Number of Children: N/A  . Years of Education: N/A   Occupational History  . Not on file.   Social History Main Topics  . Smoking status: Never Smoker   . Smokeless tobacco: Never Used  . Alcohol Use: Yes  . Drug Use: No  . Sexually Active: Not on file   Other Topics Concern  . Not on file   Social History Narrative  . No narrative on file    Past Surgical History  Procedure Laterality Date  . Mastoid surgery    .  Tendon repair      left foot  . Cataract extraction      Family History  Problem Relation Age of Onset  . Heart disease Mother   . Cancer Father     lung ca    No Known Allergies  Current Outpatient Prescriptions on File Prior to Visit  Medication Sig Dispense Refill  . ACCU-CHEK SOFTCLIX LANCETS lancets by Other route daily. Use as instructed      . aspirin 325 MG tablet Take 325 mg by mouth daily.      . benazepril-hydrochlorthiazide  (LOTENSIN HCT) 10-12.5 MG per tablet Take 1 tablet by mouth daily.  90 tablet  3  . ibuprofen (ADVIL,MOTRIN) 200 MG tablet Take 400 mg by mouth every 6 (six) hours as needed.      . metFORMIN (GLUCOPHAGE) 1000 MG tablet Take 1 tablet (1,000 mg total) by mouth 2 (two) times daily with a meal.  180 tablet  6  . pravastatin (PRAVACHOL) 20 MG tablet Take 1 tablet (20 mg total) by mouth daily.  90 tablet  5  . TRAVATAN Z 0.004 % SOLN ophthalmic solution        No current facility-administered medications on file prior to visit.    BP 122/70  Pulse 96  Temp(Src) 98.3 F (36.8 C) (Oral)  Resp 20  Ht 5\' 9"  (1.753 m)  Wt 194 lb (87.998 kg)  BMI 28.64 kg/m2  SpO2 95%       Review of Systems  Constitutional: Negative for fever, chills, appetite change and fatigue.  HENT: Negative for hearing loss, ear pain, congestion, sore throat, trouble swallowing, neck stiffness, dental problem, voice change and tinnitus.   Eyes: Negative for pain, discharge and visual disturbance.  Respiratory: Negative for cough, chest tightness, wheezing and stridor.   Cardiovascular: Negative for chest pain, palpitations and leg swelling.  Gastrointestinal: Negative for nausea, vomiting, abdominal pain, diarrhea, constipation, blood in stool and abdominal distention.  Genitourinary: Negative for urgency, hematuria, flank pain, discharge, difficulty urinating and genital sores.  Musculoskeletal: Negative for myalgias, back pain, joint swelling, arthralgias and gait problem.  Skin: Negative for rash.  Neurological: Negative for dizziness, syncope, speech difficulty, weakness, numbness and headaches.  Hematological: Negative for adenopathy. Does not bruise/bleed easily.  Psychiatric/Behavioral: Negative for behavioral problems and dysphoric mood. The patient is not nervous/anxious.        Objective:   Physical Exam  Constitutional: He appears well-developed and well-nourished.  Blood pressure 140/80 bilaterally   HENT:  Head: Normocephalic and atraumatic.  Right Ear: External ear normal.  Left Ear: External ear normal.  Nose: Nose normal.  Mouth/Throat: Oropharynx is clear and moist.  Eyes: Conjunctivae and EOM are normal. Pupils are equal, round, and reactive to light. No scleral icterus.  Neck: Normal range of motion. Neck supple. No JVD present. No thyromegaly present.  Cardiovascular: Regular rhythm, normal heart sounds and intact distal pulses.  Exam reveals no gallop and no friction rub.   No murmur heard. Pulmonary/Chest: Effort normal and breath sounds normal. He exhibits no tenderness.  Abdominal: Soft. Bowel sounds are normal. He exhibits no distension and no mass. There is no tenderness.  Genitourinary: Prostate normal and penis normal.  Musculoskeletal: Normal range of motion. He exhibits no edema and no tenderness.   Compression hose noted involving both the lower extremities. No significant edema. Pedal pulses nonpalpable through the compression hose  Lymphadenopathy:    He has no cervical adenopathy.  Neurological: He is alert. He has  normal reflexes. No cranial nerve deficit. Coordination normal.  Skin: Skin is warm and dry. No rash noted.  Psychiatric: He has a normal mood and affect. His behavior is normal.          Assessment & Plan:   Preventive health examination Type 2 diabetes. We'll check a hemoglobin A1c Dyslipidemia Hypertension PA D.  Recheck 3 months

## 2012-11-19 NOTE — Patient Instructions (Signed)
Please check your hemoglobin A1c every 3 months  Limit your sodium (Salt) intake   

## 2013-02-18 ENCOUNTER — Ambulatory Visit: Payer: 59 | Admitting: Internal Medicine

## 2013-02-22 ENCOUNTER — Ambulatory Visit (INDEPENDENT_AMBULATORY_CARE_PROVIDER_SITE_OTHER): Payer: 59 | Admitting: Internal Medicine

## 2013-02-22 ENCOUNTER — Encounter: Payer: Self-pay | Admitting: Internal Medicine

## 2013-02-22 VITALS — BP 128/80 | HR 64 | Temp 98.0°F | Resp 20 | Wt 197.0 lb

## 2013-02-22 DIAGNOSIS — E119 Type 2 diabetes mellitus without complications: Secondary | ICD-10-CM

## 2013-02-22 DIAGNOSIS — I739 Peripheral vascular disease, unspecified: Secondary | ICD-10-CM

## 2013-02-22 DIAGNOSIS — I10 Essential (primary) hypertension: Secondary | ICD-10-CM

## 2013-02-22 LAB — HEMOGLOBIN A1C: Hgb A1c MFr Bld: 6.9 % — ABNORMAL HIGH (ref 4.6–6.5)

## 2013-02-22 NOTE — Progress Notes (Signed)
Subjective:    Patient ID: Darryl Leonard., male    DOB: Oct 19, 1923, 79 y.o.   MRN: [id number]  HPI  79 year old patient who is seen today for followup of diabetes. He has peripheral vascular disease and treated hypertension. Doing quite well. No concerns or complaints. He did have his flu vaccine last month and also was seen by ophthalmology last month. No cardiopulmonary complaints. No claudication.  Past Medical History  Diagnosis Date  . BENIGN PROSTATIC HYPERTROPHY 11/25/2007  . DIABETES MELLITUS, TYPE II 11/10/2006  . HYPERLIPIDEMIA 11/10/2006  . HYPERTENSION 12/13/2008  . OSTEOARTHRITIS 11/10/2006  . Rheumatic fever     History   Social History  . Marital Status: Married    Spouse Name: N/A    Number of Children: N/A  . Years of Education: N/A   Occupational History  . Not on file.   Social History Main Topics  . Smoking status: Never Smoker   . Smokeless tobacco: Never Used  . Alcohol Use: Yes  . Drug Use: No  . Sexual Activity: Not on file   Other Topics Concern  . Not on file   Social History Narrative  . No narrative on file    Past Surgical History  Procedure Laterality Date  . Mastoid surgery    . Tendon repair      left foot  . Cataract extraction      Family History  Problem Relation Age of Onset  . Heart disease Mother   . Cancer Father     lung ca    No Known Allergies  Current Outpatient Prescriptions on File Prior to Visit  Medication Sig Dispense Refill  . ACCU-CHEK SOFTCLIX LANCETS lancets by Other route daily. Use as instructed      . aspirin 325 MG tablet Take 325 mg by mouth daily.      . benazepril-hydrochlorthiazide (LOTENSIN HCT) 10-12.5 MG per tablet Take 1 tablet by mouth daily.  90 tablet  3  . glucose blood (ACCU-CHEK COMPACT STRIPS) test strip 1 each by Other route daily as needed for other. Use daily  100 each  6  . ibuprofen (ADVIL,MOTRIN) 200 MG tablet Take 400 mg by mouth every 6 (six) hours as needed.      . metFORMIN  (GLUCOPHAGE) 1000 MG tablet Take 1 tablet (1,000 mg total) by mouth 2 (two) times daily with a meal.  180 tablet  3  . pravastatin (PRAVACHOL) 20 MG tablet Take 1 tablet (20 mg total) by mouth daily.  90 tablet  3  . TRAVATAN Z 0.004 % SOLN ophthalmic solution Place 1 drop into both eyes at bedtime.        No current facility-administered medications on file prior to visit.    BP 128/80  Pulse 64  Temp(Src) 98 F (36.7 C) (Oral)  Resp 20  Wt 197 lb (89.359 kg)  BMI 29.08 kg/m2  SpO2 95%       Review of Systems  Constitutional: Negative for fever, chills, appetite change and fatigue.  HENT: Negative for congestion, dental problem, ear pain, hearing loss, sore throat, tinnitus, trouble swallowing and voice change.   Eyes: Negative for pain, discharge and visual disturbance.  Respiratory: Negative for cough, chest tightness, wheezing and stridor.   Cardiovascular: Negative for chest pain, palpitations and leg swelling.  Gastrointestinal: Negative for nausea, vomiting, abdominal pain, diarrhea, constipation, blood in stool and abdominal distention.  Genitourinary: Negative for urgency, hematuria, flank pain, discharge, difficulty urinating and genital sores.  Musculoskeletal:  Negative for arthralgias, back pain, gait problem, joint swelling, myalgias and neck stiffness.  Skin: Negative for rash.  Neurological: Negative for dizziness, syncope, speech difficulty, weakness, numbness and headaches.  Hematological: Negative for adenopathy. Does not bruise/bleed easily.  Psychiatric/Behavioral: Negative for behavioral problems and dysphoric mood. The patient is not nervous/anxious.        Objective:   Physical Exam  Constitutional: He is oriented to person, place, and time. He appears well-developed.  HENT:  Head: Normocephalic.  Right Ear: External ear normal.  Left Ear: External ear normal.  Eyes: Conjunctivae and EOM are normal.  Neck: Normal range of motion.  Cardiovascular:  Normal rate and normal heart sounds.   Pulmonary/Chest: Breath sounds normal.  Abdominal: Bowel sounds are normal.  Musculoskeletal: Normal range of motion. He exhibits no edema and no tenderness.  Neurological: He is alert and oriented to person, place, and time.  Psychiatric: He has a normal mood and affect. His behavior is normal.          Assessment & Plan:   Hypertension Diabetes mellitus. We'll check a hemoglobin A1c  Low-salt diet recommended Continue present regimen Recheck 3 months

## 2013-02-22 NOTE — Patient Instructions (Signed)
Limit your sodium (Salt) intake   Please check your hemoglobin A1c every 3 months   

## 2013-04-13 ENCOUNTER — Telehealth: Payer: Self-pay | Admitting: Internal Medicine

## 2013-04-13 MED ORDER — ONETOUCH ULTRA MINI W/DEVICE KIT: 1.0000 | PACK | Freq: Every day | Status: DC | PRN

## 2013-04-13 MED ORDER — GLUCOSE BLOOD VI STRP: 1.0000 | ORAL_STRIP | Freq: Every day | Status: DC

## 2013-04-13 MED ORDER — ONETOUCH DELICA LANCETS FINE MISC: 1.0000 | Freq: Every day | Status: DC | PRN

## 2013-04-13 NOTE — Telephone Encounter (Signed)
Pharm states they have faxed several request for diabetic supplies since 11/30 and have had no response. pls advise. Pt has been twice to [facility].  test strip Meter One touch ultra ( pt has coupon) Lancets rx must have DX code and detailed directions

## 2013-04-13 NOTE — Telephone Encounter (Signed)
Called pharmacy told them will send requests for Meter, Test strips and Lancets.

## 2013-06-23 ENCOUNTER — Ambulatory Visit (INDEPENDENT_AMBULATORY_CARE_PROVIDER_SITE_OTHER): Payer: 59 | Admitting: Family

## 2013-06-23 ENCOUNTER — Telehealth: Payer: Self-pay | Admitting: Internal Medicine

## 2013-06-23 ENCOUNTER — Encounter: Payer: Self-pay | Admitting: Family

## 2013-06-23 VITALS — BP 144/70 | HR 90 | Ht 69.0 in | Wt 184.0 lb

## 2013-06-23 DIAGNOSIS — J322 Chronic ethmoidal sinusitis: Secondary | ICD-10-CM

## 2013-06-23 DIAGNOSIS — J209 Acute bronchitis, unspecified: Secondary | ICD-10-CM

## 2013-06-23 MED ORDER — BECLOMETHASONE DIPROPIONATE 80 MCG/ACT IN AERS: 2.0000 | INHALATION_SPRAY | Freq: Two times a day (BID) | RESPIRATORY_TRACT | Status: DC

## 2013-06-23 MED ORDER — AMOXICILLIN-POT CLAVULANATE 875-125 MG PO TABS: 1.0000 | ORAL_TABLET | Freq: Two times a day (BID) | ORAL | Status: DC

## 2013-06-23 NOTE — Telephone Encounter (Signed)
Patient Information:  Caller Name: Darryl Leonard  Phone: [phone]  Patient: Darryl Leonard, Darryl Leonard  Gender: Male  DOB: 10/06/1923  Age: 80 Years  PCP: Eleonore ChiquitoKwiatkowski, Peter (Family Practice > 4065yrs old)  Office Follow Up:  Does the office need to follow up with this patient?: No  Instructions For The Office: N/A  RN Note:  Sore throat and cough unrelieved by homecare. Coughing up clear to yellow phlegm.   Symptoms  Reason For Call & Symptoms: Cough, sore throat  Reviewed Health History In EMR: Yes  Reviewed Medications In EMR: Yes  Reviewed Allergies In EMR: Yes  Reviewed Surgeries / Procedures: Yes  Date of Onset of Symptoms: 06/16/2013  Treatments Tried: OTC cough meds  Treatments Tried Worked: No  Guideline(s) Used:  Sore Throat  Disposition Per Guideline:   See Today in Office  Reason For Disposition Reached:   Diabetes mellitus or weak immune system (e.g., HIV positive, cancer chemo, splenectomy, organ transplant, chronic steroids)  Advice Given:  Pain Medicines:  Acetaminophen (e.g., Tylenol):  Regular Strength Tylenol: Take 650 mg (two 325 mg pills) by mouth every 4-6 hours as needed. Each Regular Strength Tylenol pill has 325 mg of acetaminophen.  Call Back If:  You become worse.  Patient Will Follow Care Advice:  YES  Appointment Scheduled:  06/23/2013 13:30:00 Appointment Scheduled Provider:  Berniece AndreasPanosh, Wanda [facility](Family Practice)

## 2013-06-23 NOTE — Patient Instructions (Signed)
Acute Bronchitis Bronchitis is inflammation of the airways that extend from the windpipe into the lungs (bronchi). The inflammation often causes mucus to develop. This leads to a cough, which is the most common symptom of bronchitis.  In acute bronchitis, the condition usually develops suddenly and goes away over time, usually in a couple weeks. Smoking, allergies, and asthma can make bronchitis worse. Repeated episodes of bronchitis may cause further lung problems.  CAUSES Acute bronchitis is most often caused by the same virus that causes a cold. The virus can spread from person to person (contagious).  SIGNS AND SYMPTOMS   Cough.   Fever.   Coughing up mucus.   Body aches.   Chest congestion.   Chills.   Shortness of breath.   Sore throat.  DIAGNOSIS  Acute bronchitis is usually diagnosed through a physical exam. Tests, such as chest X-rays, are sometimes done to rule out other conditions.  TREATMENT  Acute bronchitis usually goes away in a couple weeks. Often times, no medical treatment is necessary. Medicines are sometimes given for relief of fever or cough. Antibiotics are usually not needed but may be prescribed in certain situations. In some cases, an inhaler may be recommended to help reduce shortness of breath and control the cough. A cool mist vaporizer may also be used to help thin bronchial secretions and make it easier to clear the chest.  HOME CARE INSTRUCTIONS  Get plenty of rest.   Drink enough fluids to keep your urine clear or pale yellow (unless you have a medical condition that requires fluid restriction). Increasing fluids may help thin your secretions and will prevent dehydration.   Only take over-the-counter or prescription medicines as directed by your health care provider.   Avoid smoking and secondhand smoke. Exposure to cigarette smoke or irritating chemicals will make bronchitis worse. If you are a smoker, consider using nicotine gum or skin  patches to help control withdrawal symptoms. Quitting smoking will help your lungs heal faster.   Reduce the chances of another bout of acute bronchitis by washing your hands frequently, avoiding people with cold symptoms, and trying not to touch your hands to your mouth, nose, or eyes.   Follow up with your health care provider as directed.  SEEK MEDICAL CARE IF: Your symptoms do not improve after 1 week of treatment.  SEEK IMMEDIATE MEDICAL CARE IF:  You develop an increased fever or chills.   You have chest pain.   You have severe shortness of breath.  You have bloody sputum.   You develop dehydration.  You develop fainting.  You develop repeated vomiting.  You develop a severe headache. MAKE SURE YOU:   Understand these instructions.  Will watch your condition.  Will get help right away if you are not doing well or get worse. Document Released: 06/06/2004 Document Revised: 12/30/2012 Document Reviewed: 10/20/2012 [employer] Patient Information 2014 [employer].  Sinusitis Sinusitis is redness, soreness, and swelling (inflammation) of the paranasal sinuses. Paranasal sinuses are air pockets within the bones of your face (beneath the eyes, the middle of the forehead, or above the eyes). In healthy paranasal sinuses, mucus is able to drain out, and air is able to circulate through them by way of your nose. However, when your paranasal sinuses are inflamed, mucus and air can become trapped. This can allow bacteria and other germs to grow and cause infection. Sinusitis can develop quickly and last only a short time (acute) or continue over a long period (chronic). Sinusitis that   lasts for more than 12 weeks is considered chronic.  CAUSES  Causes of sinusitis include:  Allergies.  Structural abnormalities, such as displacement of the cartilage that separates your nostrils (deviated septum), which can decrease the air flow through your nose and sinuses and affect sinus  drainage.  Functional abnormalities, such as when the small hairs (cilia) that line your sinuses and help remove mucus do not work properly or are not present. SYMPTOMS  Symptoms of acute and chronic sinusitis are the same. The primary symptoms are pain and pressure around the affected sinuses. Other symptoms include:  Upper toothache.  Earache.  Headache.  Bad breath.  Decreased sense of smell and taste.  A cough, which worsens when you are lying flat.  Fatigue.  Fever.  Thick drainage from your nose, which often is green and may contain pus (purulent).  Swelling and warmth over the affected sinuses. DIAGNOSIS  Your caregiver will perform a physical exam. During the exam, your caregiver may:  Look in your nose for signs of abnormal growths in your nostrils (nasal polyps).  Tap over the affected sinus to check for signs of infection.  View the inside of your sinuses (endoscopy) with a special imaging device with a light attached (endoscope), which is inserted into your sinuses. If your caregiver suspects that you have chronic sinusitis, one or more of the following tests may be recommended:  Allergy tests.  Nasal culture A sample of mucus is taken from your nose and sent to a lab and screened for bacteria.  Nasal cytology A sample of mucus is taken from your nose and examined by your caregiver to determine if your sinusitis is related to an allergy. TREATMENT  Most cases of acute sinusitis are related to a viral infection and will resolve on their own within 10 days. Sometimes medicines are prescribed to help relieve symptoms (pain medicine, decongestants, nasal steroid sprays, or saline sprays).  However, for sinusitis related to a bacterial infection, your caregiver will prescribe antibiotic medicines. These are medicines that will help kill the bacteria causing the infection.  Rarely, sinusitis is caused by a fungal infection. In theses cases, your caregiver will  prescribe antifungal medicine. For some cases of chronic sinusitis, surgery is needed. Generally, these are cases in which sinusitis recurs more than 3 times per year, despite other treatments. HOME CARE INSTRUCTIONS   Drink plenty of water. Water helps thin the mucus so your sinuses can drain more easily.  Use a humidifier.  Inhale steam 3 to 4 times a day (for example, sit in the bathroom with the shower running).  Apply a warm, moist washcloth to your face 3 to 4 times a day, or as directed by your caregiver.  Use saline nasal sprays to help moisten and clean your sinuses.  Take over-the-counter or prescription medicines for pain, discomfort, or fever only as directed by your caregiver. SEEK IMMEDIATE MEDICAL CARE IF:  You have increasing pain or severe headaches.  You have nausea, vomiting, or drowsiness.  You have swelling around your face.  You have vision problems.  You have a stiff neck.  You have difficulty breathing. MAKE SURE YOU:   Understand these instructions.  Will watch your condition.  Will get help right away if you are not doing well or get worse. Document Released: 04/29/2005 Document Revised: 07/22/2011 Document Reviewed: 05/14/2011 [employer] Patient Information 2014 [employer].  

## 2013-06-23 NOTE — Progress Notes (Signed)
Pre visit review using our clinic review tool, if applicable. No additional management support is needed unless otherwise documented below in the visit note. 

## 2013-06-23 NOTE — Progress Notes (Signed)
Subjective:    Patient ID: Darryl Leonard., male    DOB: 1923/05/20, 80 y.o.   MRN: [id number]  Sore Throat  Associated symptoms include congestion and coughing.  Cough Associated symptoms include rhinorrhea, a sore throat and wheezing.   80 y.o. White male who presents today with chief complaint of "cough, sore throat, congestion". Symptoms began 7 days ago with a cough, followed by sore throat and nasal congestion. Pt states that the cough is productive with yellow phlegm, he also acknowledges the same color discharge coming from his eyes in the morning. Pt acknowledges feeling like he has "swollen sinuses". Pt has used saline spray to help reduce congestion. He denies fever, chills, fatigue.     Review of Systems  Constitutional: Negative.   HENT: Positive for congestion, facial swelling, rhinorrhea, sinus pressure and sore throat.   Eyes: Positive for discharge.       Yellow eye discharge bilaterally in the morning.   Respiratory: Positive for cough and wheezing.   Cardiovascular: Negative.   Gastrointestinal: Negative.   Endocrine: Negative.   Genitourinary: Negative.   Musculoskeletal: Negative.   Skin: Negative.   Allergic/Immunologic: Negative.   Neurological: Negative.   Hematological: Negative.   Psychiatric/Behavioral: Negative.    Past Medical History  Diagnosis Date  . BENIGN PROSTATIC HYPERTROPHY 11/25/2007  . DIABETES MELLITUS, TYPE II 11/10/2006  . HYPERLIPIDEMIA 11/10/2006  . HYPERTENSION 12/13/2008  . OSTEOARTHRITIS 11/10/2006  . Rheumatic fever     History   Social History  . Marital Status: Married    Spouse Name: N/A    Number of Children: N/A  . Years of Education: N/A   Occupational History  . Not on file.   Social History Main Topics  . Smoking status: Never Smoker   . Smokeless tobacco: Never Used  . Alcohol Use: Yes  . Drug Use: No  . Sexual Activity: Not on file   Other Topics Concern  . Not on file   Social History Narrative  .  No narrative on file    Past Surgical History  Procedure Laterality Date  . Mastoid surgery    . Tendon repair      left foot  . Cataract extraction      Family History  Problem Relation Age of Onset  . Heart disease Mother   . Cancer Father     lung ca    No Known Allergies  Current Outpatient Prescriptions on File Prior to Visit  Medication Sig Dispense Refill  . aspirin 325 MG tablet Take 325 mg by mouth daily.      . benazepril-hydrochlorthiazide (LOTENSIN HCT) 10-12.5 MG per tablet Take 1 tablet by mouth daily.  90 tablet  3  . Blood Glucose Monitoring Suppl (ONE TOUCH ULTRA MINI) W/DEVICE KIT 1 each by Other route daily as needed.  1 each  0  . glucose blood (ONE TOUCH ULTRA TEST) test strip 1 each by Other route daily.  100 each  12  . ibuprofen (ADVIL,MOTRIN) 200 MG tablet Take 400 mg by mouth every 6 (six) hours as needed.      . metFORMIN (GLUCOPHAGE) 1000 MG tablet Take 1 tablet (1,000 mg total) by mouth 2 (two) times daily with a meal.  180 tablet  3  . ONETOUCH DELICA LANCETS FINE MISC 1 each by Other route daily as needed.  100 each  12  . pravastatin (PRAVACHOL) 20 MG tablet Take 1 tablet (20 mg total) by mouth daily.  90 tablet  3  . TRAVATAN Z 0.004 % SOLN ophthalmic solution Place 1 drop into both eyes at bedtime.        No current facility-administered medications on file prior to visit.    BP 144/70  Pulse 90  Ht $R'5\' 9"'qn$  (1.753 m)  Wt 184 lb (83.462 kg)  BMI 27.16 kg/m2chart    Objective:   Physical Exam  Constitutional: He is oriented to person, place, and time. He appears well-developed and well-nourished. He is active.  HENT:  Head: Normocephalic.  Right Ear: Tympanic membrane, external ear and ear canal normal.  Left Ear: Tympanic membrane, external ear and ear canal normal.  Nose: Rhinorrhea present. Right sinus exhibits frontal sinus tenderness. Left sinus exhibits frontal sinus tenderness.  Mouth/Throat: Uvula is midline and mucous membranes  are normal. Posterior oropharyngeal erythema present.  Eyes: Conjunctivae and lids are normal.  Cardiovascular: Normal rate, regular rhythm and normal heart sounds.   Pulmonary/Chest: Effort normal. He has wheezes in the right upper field, the right lower field, the left upper field and the left lower field.  Abdominal: Soft. Normal appearance and bowel sounds are normal.  Neurological: He is alert and oriented to person, place, and time.  Skin: Skin is warm, dry and intact.  Psychiatric: He has a normal mood and affect. His speech is normal and behavior is normal.          Assessment & Plan:  Leland was seen today for sore throat and cough.  Diagnoses and associated orders for this visit:  Ethmoid sinusitis  Acute bronchitis  Other Orders - amoxicillin-clavulanate (AUGMENTIN) 875-125 MG per tablet; Take 1 tablet by mouth 2 (two) times daily. - beclomethasone (QVAR) 80 MCG/ACT inhaler; Inhale 2 puffs into the lungs 2 (two) times daily.

## 2013-08-23 ENCOUNTER — Encounter: Payer: Self-pay | Admitting: Internal Medicine

## 2013-08-23 ENCOUNTER — Ambulatory Visit (INDEPENDENT_AMBULATORY_CARE_PROVIDER_SITE_OTHER): Payer: 59 | Admitting: Internal Medicine

## 2013-08-23 VITALS — BP 140/70 | HR 69 | Temp 98.2°F | Resp 20 | Ht 69.0 in | Wt 188.0 lb

## 2013-08-23 DIAGNOSIS — I10 Essential (primary) hypertension: Secondary | ICD-10-CM

## 2013-08-23 DIAGNOSIS — Z23 Encounter for immunization: Secondary | ICD-10-CM

## 2013-08-23 DIAGNOSIS — E119 Type 2 diabetes mellitus without complications: Secondary | ICD-10-CM

## 2013-08-23 DIAGNOSIS — E785 Hyperlipidemia, unspecified: Secondary | ICD-10-CM

## 2013-08-23 DIAGNOSIS — M1990 Unspecified osteoarthritis, unspecified site: Secondary | ICD-10-CM

## 2013-08-23 DIAGNOSIS — I739 Peripheral vascular disease, unspecified: Secondary | ICD-10-CM

## 2013-08-23 LAB — HEMOGLOBIN A1C: HEMOGLOBIN A1C: 6.6 % — AB (ref 4.6–6.5)

## 2013-08-23 NOTE — Progress Notes (Signed)
Pre-visit discussion using our clinic review tool. No additional management support is needed unless otherwise documented below in the visit note.  

## 2013-08-23 NOTE — Patient Instructions (Signed)
Please check your hemoglobin A1c every 3 months  Limit your sodium (Salt) intake    It is important that you exercise regularly, at least 20 minutes 3 to 4 times per week.  If you develop chest pain or shortness of breath seek  medical attention.   

## 2013-08-23 NOTE — Progress Notes (Signed)
Subjective:    Patient ID: Darryl Leonard., male    DOB: 07/21/1923, 80 y.o.   MRN: [id number]  HPI  80 year old patient who is seen today for followup.  He has history of type 2 diabetes.  Additionally, he has hypertension PAD, and a history of osteoarthritis.  He is scheduled for an eye examination next week.  Denies any claudication or any exertional chest pain. He remains on statin therapy, which he continues to tolerate well  Past Medical History  Diagnosis Date  . BENIGN PROSTATIC HYPERTROPHY 11/25/2007  . DIABETES MELLITUS, TYPE II 11/10/2006  . HYPERLIPIDEMIA 11/10/2006  . HYPERTENSION 12/13/2008  . OSTEOARTHRITIS 11/10/2006  . Rheumatic fever     History   Social History  . Marital Status: Married    Spouse Name: N/A    Number of Children: N/A  . Years of Education: N/A   Occupational History  . Not on file.   Social History Main Topics  . Smoking status: Never Smoker   . Smokeless tobacco: Never Used  . Alcohol Use: Yes  . Drug Use: No  . Sexual Activity: Not on file   Other Topics Concern  . Not on file   Social History Narrative  . No narrative on file    Past Surgical History  Procedure Laterality Date  . Mastoid surgery    . Tendon repair      left foot  . Cataract extraction      Family History  Problem Relation Age of Onset  . Heart disease Mother   . Cancer Father     lung ca    No Known Allergies  Current Outpatient Prescriptions on File Prior to Visit  Medication Sig Dispense Refill  . aspirin 325 MG tablet Take 325 mg by mouth daily.      . benazepril-hydrochlorthiazide (LOTENSIN HCT) 10-12.5 MG per tablet Take 1 tablet by mouth daily.  90 tablet  3  . Blood Glucose Monitoring Suppl (ONE TOUCH ULTRA MINI) W/DEVICE KIT 1 each by Other route daily as needed.  1 each  0  . glucose blood (ONE TOUCH ULTRA TEST) test strip 1 each by Other route daily.  100 each  12  . ibuprofen (ADVIL,MOTRIN) 200 MG tablet Take 400 mg by mouth every 6  (six) hours as needed.      . metFORMIN (GLUCOPHAGE) 1000 MG tablet Take 1 tablet (1,000 mg total) by mouth 2 (two) times daily with a meal.  180 tablet  3  . ONETOUCH DELICA LANCETS FINE MISC 1 each by Other route daily as needed.  100 each  12  . pravastatin (PRAVACHOL) 20 MG tablet Take 1 tablet (20 mg total) by mouth daily.  90 tablet  3  . TRAVATAN Z 0.004 % SOLN ophthalmic solution Place 1 drop into both eyes at bedtime.        No current facility-administered medications on file prior to visit.    BP 140/70  Pulse 69  Temp(Src) 98.2 F (36.8 C) (Oral)  Resp 20  Ht $R'5\' 9"'dv$  (1.753 m)  Wt 188 lb (85.276 kg)  BMI 27.75 kg/m2  SpO2 98%     Review of Systems  Constitutional: Negative for fever, chills, appetite change and fatigue.  HENT: Negative for congestion, dental problem, ear pain, hearing loss, sore throat, tinnitus, trouble swallowing and voice change.   Eyes: Negative for pain, discharge and visual disturbance.  Respiratory: Negative for cough, chest tightness, wheezing and stridor.   Cardiovascular: Negative  for chest pain, palpitations and leg swelling.  Gastrointestinal: Negative for nausea, vomiting, abdominal pain, diarrhea, constipation, blood in stool and abdominal distention.  Genitourinary: Negative for urgency, hematuria, flank pain, discharge, difficulty urinating and genital sores.  Musculoskeletal: Negative for arthralgias, back pain, gait problem, joint swelling, myalgias and neck stiffness.  Skin: Negative for rash.  Neurological: Negative for dizziness, syncope, speech difficulty, weakness, numbness and headaches.  Hematological: Negative for adenopathy. Does not bruise/bleed easily.  Psychiatric/Behavioral: Negative for behavioral problems and dysphoric mood. The patient is not nervous/anxious.        Objective:   Physical Exam  Constitutional: He is oriented to person, place, and time. He appears well-developed.  HENT:  Head: Normocephalic.  Right  Ear: External ear normal.  Left Ear: External ear normal.  Eyes: Conjunctivae and EOM are normal.  Neck: Normal range of motion.  Cardiovascular: Normal rate and normal heart sounds.   Pedal pulses absent  Pulmonary/Chest: Breath sounds normal.  Abdominal: Bowel sounds are normal.  Musculoskeletal: Normal range of motion. He exhibits edema. He exhibits no tenderness.  Neurological: He is alert and oriented to person, place, and time.  Psychiatric: He has a normal mood and affect. His behavior is normal.          Assessment & Plan:   Diabetes mellitus.  We'll check a hemoglobin A1c  hypertension, controlled PAD.  Asymptomatic  Eye  examination, as scheduled

## 2013-08-31 ENCOUNTER — Telehealth: Payer: Self-pay | Admitting: *Deleted

## 2013-08-31 NOTE — Telephone Encounter (Signed)
Left message on voicemail to call office. Hemoglobin A1c: 6.6

## 2013-09-01 NOTE — Telephone Encounter (Signed)
Spoke to pt's wife told her Hemoglobin A1c was 6.6 good. Darryl CroftMildred verbalized understanding and stated she will let him know.

## 2013-09-06 ENCOUNTER — Telehealth: Payer: Self-pay

## 2013-09-06 NOTE — Telephone Encounter (Signed)
Relevant patient education mailed to patient.  

## 2013-11-11 ENCOUNTER — Telehealth: Payer: Self-pay | Admitting: *Deleted

## 2013-11-11 MED ORDER — GLUCOSE BLOOD VI STRP: 1.0000 | ORAL_STRIP | Freq: Every day | Status: DC | PRN

## 2013-11-11 NOTE — Telephone Encounter (Signed)
Spoke to pt's wife [name], told her I received a fax from pharmacy regarding test strips. Asked her how often pt is testing? [name] said pt was testing numerous times a day when sugar dropped, but it is back to normal now and he is testing just once a day. Told her okay I will send new Rx to pharmacy for test strips and put daily prn with refills and he should be okay. [name] verbalized understanding.

## 2013-11-23 ENCOUNTER — Other Ambulatory Visit: Payer: Self-pay | Admitting: Internal Medicine

## 2013-11-29 ENCOUNTER — Other Ambulatory Visit: Payer: Self-pay | Admitting: Internal Medicine

## 2013-12-13 ENCOUNTER — Encounter: Payer: 59 | Admitting: Internal Medicine

## 2013-12-14 ENCOUNTER — Other Ambulatory Visit: Payer: Self-pay | Admitting: Internal Medicine

## 2014-01-03 ENCOUNTER — Telehealth: Payer: Self-pay | Admitting: Internal Medicine

## 2014-01-03 NOTE — Telephone Encounter (Signed)
Darryl Leonard notified pt's hemoglobin A1c was 6.6 in April.

## 2014-01-03 NOTE — Telephone Encounter (Signed)
Pt is needing a1c results that were done in April, pt has an eye appt this evening and states the dr. Always ask for the last a1c results.

## 2014-02-07 LAB — HM DIABETES EYE EXAM

## 2014-02-16 ENCOUNTER — Telehealth: Payer: Self-pay | Admitting: Internal Medicine

## 2014-02-16 ENCOUNTER — Encounter: Payer: Self-pay | Admitting: Internal Medicine

## 2014-02-16 ENCOUNTER — Ambulatory Visit (INDEPENDENT_AMBULATORY_CARE_PROVIDER_SITE_OTHER): Payer: 59 | Admitting: Internal Medicine

## 2014-02-16 VITALS — BP 132/80 | HR 70 | Temp 98.0°F | Resp 20 | Ht 68.75 in | Wt 190.0 lb

## 2014-02-16 DIAGNOSIS — M150 Primary generalized (osteo)arthritis: Secondary | ICD-10-CM

## 2014-02-16 DIAGNOSIS — I10 Essential (primary) hypertension: Secondary | ICD-10-CM

## 2014-02-16 DIAGNOSIS — E7800 Pure hypercholesterolemia, unspecified: Secondary | ICD-10-CM

## 2014-02-16 DIAGNOSIS — M159 Polyosteoarthritis, unspecified: Secondary | ICD-10-CM

## 2014-02-16 DIAGNOSIS — Z0000 Encounter for general adult medical examination without abnormal findings: Secondary | ICD-10-CM

## 2014-02-16 DIAGNOSIS — E118 Type 2 diabetes mellitus with unspecified complications: Secondary | ICD-10-CM

## 2014-02-16 DIAGNOSIS — E119 Type 2 diabetes mellitus without complications: Secondary | ICD-10-CM

## 2014-02-16 DIAGNOSIS — I739 Peripheral vascular disease, unspecified: Secondary | ICD-10-CM

## 2014-02-16 LAB — LIPID PANEL
Cholesterol: 173 mg/dL (ref 0–200)
HDL: 46.5 mg/dL (ref >39.00)
LDL CALC: 102 mg/dL — AB (ref 0–99)
NonHDL: 126.5
Total CHOL/HDL Ratio: 4
Triglycerides: 125 mg/dL (ref 0.0–149.0)
VLDL: 25 mg/dL (ref 0.0–40.0)

## 2014-02-16 LAB — CBC WITH DIFFERENTIAL/PLATELET
Basophils Absolute: 0 10*3/uL (ref 0.0–0.1)
Basophils Relative: 0.5 % (ref 0.0–3.0)
EOS PCT: 5.2 % — AB (ref 0.0–5.0)
Eosinophils Absolute: 0.3 10*3/uL (ref 0.0–0.7)
HEMATOCRIT: 35.3 % — AB (ref 39.0–52.0)
Hemoglobin: 11.5 g/dL — ABNORMAL LOW (ref 13.0–17.0)
LYMPHS ABS: 1.3 10*3/uL (ref 0.7–4.0)
Lymphocytes Relative: 20.4 % (ref 12.0–46.0)
MCHC: 32.5 g/dL (ref 30.0–36.0)
MCV: 94.9 fl (ref 78.0–100.0)
Monocytes Absolute: 0.5 10*3/uL (ref 0.1–1.0)
Monocytes Relative: 7.5 % (ref 3.0–12.0)
NEUTROS PCT: 66.4 % (ref 43.0–77.0)
Neutro Abs: 4.2 10*3/uL (ref 1.4–7.7)
PLATELETS: 259 10*3/uL (ref 150.0–400.0)
RBC: 3.72 Mil/uL — ABNORMAL LOW (ref 4.22–5.81)
RDW: 14.2 % (ref 11.5–15.5)
WBC: 6.4 10*3/uL (ref 4.0–10.5)

## 2014-02-16 LAB — COMPREHENSIVE METABOLIC PANEL
ALK PHOS: 62 U/L (ref 39–117)
ALT: 8 U/L (ref 0–53)
AST: 14 U/L (ref 0–37)
Albumin: 3.7 g/dL (ref 3.5–5.2)
BILIRUBIN TOTAL: 0.6 mg/dL (ref 0.2–1.2)
BUN: 21 mg/dL (ref 6–23)
CO2: 24 mEq/L (ref 19–32)
Calcium: 9.2 mg/dL (ref 8.4–10.5)
Chloride: 103 mEq/L (ref 96–112)
Creatinine, Ser: 1.4 mg/dL (ref 0.4–1.5)
GFR: 52.92 mL/min — ABNORMAL LOW (ref >60.00)
Glucose, Bld: 120 mg/dL — ABNORMAL HIGH (ref 70–99)
Potassium: 4.2 mEq/L (ref 3.5–5.1)
SODIUM: 139 meq/L (ref 135–145)
TOTAL PROTEIN: 7.4 g/dL (ref 6.0–8.3)

## 2014-02-16 LAB — HEMOGLOBIN A1C: HEMOGLOBIN A1C: 6.6 % — AB (ref 4.6–6.5)

## 2014-02-16 LAB — MICROALBUMIN / CREATININE URINE RATIO
Creatinine,U: 43.9 mg/dL
MICROALB UR: 8.7 mg/dL — AB (ref 0.0–1.9)
Microalb Creat Ratio: 19.8 mg/g (ref 0.0–30.0)

## 2014-02-16 LAB — TSH: TSH: 3.2 u[IU]/mL (ref 0.35–4.50)

## 2014-02-16 NOTE — Progress Notes (Signed)
Pre visit review using our clinic review tool, if applicable. No additional management support is needed unless otherwise documented below in the visit note. 

## 2014-02-16 NOTE — Progress Notes (Signed)
Patient ID: Darryl Favre., male   DOB: [birth year], 80 y.o.   MRN: [id number]  Subjective:    Patient ID: Darryl Favre., male    DOB: 1923/09/20, 80 y.o.   MRN: [id number]  HPI  80 year-old patient who has a history of hypertension type 2 diabetes and dyslipidemia who is seen today for an annual exam.   He had vascular surgery performed with stenting of left leg in June of 2012. He had a nonhealing wound involving the medial aspect of his left ankle. He has done quite well without concerns or complaints.   Here for Medicare AWV:   1. Risk factors based on Past M, S, F history: cardiovascular risk factors include type 2 diabetes, hypertension, dyslipidemia  2. Physical Activities: fairly active considering his age, without activity restrictions  3. Depression/mood: no history of depression or mood disorder  4. Hearing: wears a hearing aid bilaterally  5. ADL's: independent in all aspects of daily living  6. Fall Risk: mild/moderate  Due to  age 74 with a cane 7. Home Safety: no problems identified  8. Height, weight, &visual acuity:height and weight stable. No change in visual acuity has had recent eye surgery  9. Counseling: heart healthy diet more regular exercise. All encouraged  10. Labs ordered based on risk factors: laboratory profile, including lipid panel, and hemoglobin A1c will be reviewed  11. Referral Coordination- and appropriate at this time  12. Care Plan- heart healthy diet. Encouraged will continue to track serial hemoglobin A1c s and continue home blood sugar monitoring  13. Cognitive Assessment- alert and oriented, with normal affect. No history of cognitive dysfunction. 14.  Preventive services-include annual health care examinations with screening lab.  Patient provided with written and personalized preventive care plan  15.  Provider list update includes ophthalmology and vascular surgery and primary care  Preventive Screening-Counseling & Management   Alcohol-Tobacco  Smoking Status: quit  Year Quit: 1980   Allergies (verified):  No Known Drug Allergies   Past History:  Past Medical History:   Diabetes mellitus, type II  Hyperlipidemia  Osteoarthritis  BPH  Rheumatic fever, h/o  Benign prostatic hypertrophy   Past Surgical History:   Mastoid surgery 1935  L foot tendon repair  status post bilateral cataract surgery 2007  no colonoscopies  Stent in the left femoral artery June 2012  Family History:   Family History Lung cancer: died at 66 (father)  Family History of Cardiovascular disorder:  mother died CHF 65's  Father died lung Ca- 49's  2 B: both deceased unclear reasons  3 sister: MS; 2 deceased   Social History:  Reviewed history from 11/25/2007 and no changes required.  7 children 13 grandchildren  Married  Past Medical History  Diagnosis Date  . BENIGN PROSTATIC HYPERTROPHY 11/25/2007  . DIABETES MELLITUS, TYPE II 11/10/2006  . HYPERLIPIDEMIA 11/10/2006  . HYPERTENSION 12/13/2008  . OSTEOARTHRITIS 11/10/2006  . Rheumatic fever     History   Social History  . Marital Status: Married    Spouse Name: N/A    Number of Children: N/A  . Years of Education: N/A   Occupational History  . Not on file.   Social History Main Topics  . Smoking status: Never Smoker   . Smokeless tobacco: Never Used  . Alcohol Use: Yes  . Drug Use: No  . Sexual Activity: Not on file   Other Topics Concern  . Not on file   Social History Narrative  . No  narrative on file    Past Surgical History  Procedure Laterality Date  . Mastoid surgery    . Tendon repair      left foot  . Cataract extraction      Family History  Problem Relation Age of Onset  . Heart disease Mother   . Cancer Father     lung ca    No Known Allergies  Current Outpatient Prescriptions on File Prior to Visit  Medication Sig Dispense Refill  . aspirin 325 MG tablet Take 325 mg by mouth daily.      . benazepril-hydrochlorthiazide  (LOTENSIN HCT) 10-12.5 MG per tablet TAKE 1 TABLET EVERY DAY  90 tablet  1  . Blood Glucose Monitoring Suppl (ONE TOUCH ULTRA MINI) W/DEVICE KIT 1 each by Other route daily as needed.  1 each  0  . glucose blood (ONE TOUCH ULTRA TEST) test strip 1 each by Other route daily as needed for other.  100 each  5  . ibuprofen (ADVIL,MOTRIN) 200 MG tablet Take 400 mg by mouth every 6 (six) hours as needed.      . metFORMIN (GLUCOPHAGE) 1000 MG tablet TAKE ONE TABLET BY MOUTH TWICE DAILY WITH A MEAL  180 tablet  0  . ONETOUCH DELICA LANCETS FINE MISC 1 each by Other route daily as needed.  100 each  12  . pravastatin (PRAVACHOL) 20 MG tablet TAKE ONE TABLET BY MOUTH ONCE DAILY  90 tablet  1  . TRAVATAN Z 0.004 % SOLN ophthalmic solution Place 1 drop into both eyes at bedtime.        No current facility-administered medications on file prior to visit.    BP 132/80  Pulse 70  Temp(Src) 98 F (36.7 C) (Oral)  Resp 20  Ht 5' 8.75" (1.746 m)  Wt 190 lb (86.183 kg)  BMI 28.27 kg/m2  SpO2 97%       Review of Systems  Constitutional: Negative for fever, chills, appetite change and fatigue.  HENT: Negative for congestion, dental problem, ear pain, hearing loss, sore throat, tinnitus, trouble swallowing and voice change.   Eyes: Negative for pain, discharge and visual disturbance.  Respiratory: Negative for cough, chest tightness, wheezing and stridor.   Cardiovascular: Negative for chest pain, palpitations and leg swelling.  Gastrointestinal: Negative for nausea, vomiting, abdominal pain, diarrhea, constipation, blood in stool and abdominal distention.  Genitourinary: Negative for urgency, hematuria, flank pain, discharge, difficulty urinating and genital sores.  Musculoskeletal: Negative for arthralgias, back pain, gait problem, joint swelling, myalgias and neck stiffness.  Skin: Negative for rash.  Neurological: Negative for dizziness, syncope, speech difficulty, weakness, numbness and  headaches.  Hematological: Negative for adenopathy. Does not bruise/bleed easily.  Psychiatric/Behavioral: Negative for behavioral problems and dysphoric mood. The patient is not nervous/anxious.        Objective:   Physical Exam  Constitutional: He appears well-developed and well-nourished.  Blood pressure 140/80 bilaterally  HENT:  Head: Normocephalic and atraumatic.  Right Ear: External ear normal.  Left Ear: External ear normal.  Nose: Nose normal.  Mouth/Throat: Oropharynx is clear and moist.  Eyes: Conjunctivae and EOM are normal. Pupils are equal, round, and reactive to light. No scleral icterus.  Neck: Normal range of motion. Neck supple. No JVD present. No thyromegaly present.  Cardiovascular: Regular rhythm, normal heart sounds and intact distal pulses.  Exam reveals no gallop and no friction rub.   No murmur heard. Pulmonary/Chest: Effort normal. He has rales. He exhibits no tenderness.  Scattered  bibasilar rales  Abdominal: Soft. Bowel sounds are normal. He exhibits no distension and no mass. There is no tenderness.  Genitourinary: Prostate normal and penis normal.  Musculoskeletal: Normal range of motion. He exhibits edema. He exhibits no tenderness.   Compression hose noted involving both the lower extremities.  Mild edema. Pedal pulses nonpalpable through the compression hose  Lymphadenopathy:    He has no cervical adenopathy.  Neurological: He is alert. He has normal reflexes. No cranial nerve deficit. Coordination normal.  Decreased laboratory sensation distally  Skin: Skin is warm and dry. No rash noted.  Psychiatric: He has a normal mood and affect. His behavior is normal.          Assessment & Plan:   Preventive health examination Type 2 diabetes. We'll check a hemoglobin A1c Dyslipidemia Hypertension PA D.  Recheck 3 months

## 2014-02-16 NOTE — Patient Instructions (Addendum)
Limit your sodium (Salt) intake    It is important that you exercise regularly, at least 20 minutes 3 to 4 times per week.  If you develop chest pain or shortness of breath seek  medical attention.  Return in 6 months for follow-up  Please see your eye doctor yearly to check for diabetic eye damage Health Maintenance A healthy lifestyle and preventative care can promote health and wellness.  Maintain regular health, dental, and eye exams.  Eat a healthy diet. Foods like vegetables, fruits, whole grains, low-fat dairy products, and lean protein foods contain the nutrients you need and are low in calories. Decrease your intake of foods high in solid fats, added sugars, and salt. Get information about a proper diet from your health care provider, if necessary.  Regular physical exercise is one of the most important things you can do for your health. Most adults should get at least 150 minutes of moderate-intensity exercise (any activity that increases your heart rate and causes you to sweat) each week. In addition, most adults need muscle-strengthening exercises on 2 or more days a week.   Maintain a healthy weight. The body mass index (BMI) is a screening tool to identify possible weight problems. It provides an estimate of body fat based on height and weight. Your health care provider can find your BMI and can help you achieve or maintain a healthy weight. For males 20 years and older:  A BMI below 18.5 is considered underweight.  A BMI of 18.5 to 24.9 is normal.  A BMI of 25 to 29.9 is considered overweight.  A BMI of 30 and above is considered obese.  Maintain normal blood lipids and cholesterol by exercising and minimizing your intake of saturated fat. Eat a balanced diet with plenty of fruits and vegetables. Blood tests for lipids and cholesterol should begin at age 7 and be repeated every 5 years. If your lipid or cholesterol levels are high, you are over age 14, or you are at high  risk for heart disease, you may need your cholesterol levels checked more frequently.Ongoing high lipid and cholesterol levels should be treated with medicines if diet and exercise are not working.  If you smoke, find out from your health care provider how to quit. If you do not use tobacco, do not start.  Lung cancer screening is recommended for adults aged 55-80 years who are at high risk for developing lung cancer because of a history of smoking. A yearly low-dose CT scan of the lungs is recommended for people who have at least a 30-pack-year history of smoking and are current smokers or have quit within the past 15 years. A pack year of smoking is smoking an average of 1 pack of cigarettes a day for 1 year (for example, a 30-pack-year history of smoking could mean smoking 1 pack a day for 30 years or 2 packs a day for 15 years). Yearly screening should continue until the smoker has stopped smoking for at least 15 years. Yearly screening should be stopped for people who develop a health problem that would prevent them from having lung cancer treatment.  If you choose to drink alcohol, do not have more than 2 drinks per day. One drink is considered to be 12 oz (360 mL) of beer, 5 oz (150 mL) of wine, or 1.5 oz (45 mL) of liquor.  Avoid the use of street drugs. Do not share needles with anyone. Ask for help if you need support or instructions  about stopping the use of drugs.  High blood pressure causes heart disease and increases the risk of stroke. Blood pressure should be checked at least every 1-2 years. Ongoing high blood pressure should be treated with medicines if weight loss and exercise are not effective.  If you are 2045-78 years old, ask your health care provider if you should take aspirin to prevent heart disease.  Diabetes screening involves taking a blood sample to check your fasting blood sugar level. This should be done once every 3 years after age 78 if you are at a normal weight and  without risk factors for diabetes. Testing should be considered at a younger age or be carried out more frequently if you are overweight and have at least 1 risk factor for diabetes.  Colorectal cancer can be detected and often prevented. Most routine colorectal cancer screening begins at the age of 78 and continues through age 78. However, your health care provider may recommend screening at an earlier age if you have risk factors for colon cancer. On a yearly basis, your health care provider may provide home test kits to check for hidden blood in the stool. A small camera at the end of a tube may be used to directly examine the colon (sigmoidoscopy or colonoscopy) to detect the earliest forms of colorectal cancer. Talk to your health care provider about this at age 78 when routine screening begins. A direct exam of the colon should be repeated every 5-10 years through age 78, unless early forms of precancerous polyps or small growths are found.  People who are at an increased risk for hepatitis B should be screened for this virus. You are considered at high risk for hepatitis B if:  You were born in a country where hepatitis B occurs often. Talk with your health care provider about which countries are considered high risk.  Your parents were born in a high-risk country and you have not received a shot to protect against hepatitis B (hepatitis B vaccine).  You have HIV or AIDS.  You use needles to inject street drugs.  You live with, or have sex with, someone who has hepatitis B.  You are a man who has sex with other men (MSM).  You get hemodialysis treatment.  You take certain medicines for conditions like cancer, organ transplantation, and autoimmune conditions.  Hepatitis C blood testing is recommended for all people born from [id number] through 1965 and any individual with known risk factors for hepatitis C.  Healthy men should no longer receive prostate-specific antigen (PSA) blood tests as  part of routine cancer screening. Talk to your health care provider about prostate cancer screening.  Testicular cancer screening is not recommended for adolescents or adult males who have no symptoms. Screening includes self-exam, a health care provider exam, and other screening tests. Consult with your health care provider about any symptoms you have or any concerns you have about testicular cancer.  Practice safe sex. Use condoms and avoid high-risk sexual practices to reduce the spread of sexually transmitted infections (STIs).  You should be screened for STIs, including gonorrhea and chlamydia if:  You are sexually active and are younger than 24 years.  You are older than 24 years, and your health care provider tells you that you are at risk for this type of infection.  Your sexual activity has changed since you were last screened, and you are at an increased risk for chlamydia or gonorrhea. Ask your health care provider if you  are at risk.  If you are at risk of being infected with HIV, it is recommended that you take a prescription medicine daily to prevent HIV infection. This is called pre-exposure prophylaxis (PrEP). You are considered at risk if:  You are a man who has sex with other men (MSM).  You are a heterosexual man who is sexually active with multiple partners.  You take drugs by injection.  You are sexually active with a partner who has HIV.  Talk with your health care provider about whether you are at high risk of being infected with HIV. If you choose to begin PrEP, you should first be tested for HIV. You should then be tested every 3 months for as long as you are taking PrEP.  Use sunscreen. Apply sunscreen liberally and repeatedly throughout the day. You should seek shade when your shadow is shorter than you. Protect yourself by wearing long sleeves, pants, a wide-brimmed hat, and sunglasses year round whenever you are outdoors.  Tell your health care provider of new  moles or changes in moles, especially if there is a change in shape or color. Also, tell your health care provider if a mole is larger than the size of a pencil eraser.  A one-time screening for abdominal aortic aneurysm (AAA) and surgical repair of large AAAs by ultrasound is recommended for men aged 65-75 years who are current or former smokers.  Stay current with your vaccines (immunizations). Document Released: 10/26/2007 Document Revised: 05/04/2013 Document Reviewed: 09/24/2010 [facility] Patient Information 2015 [place], [place]. This information is not intended to replace advice given to you by your health care provider. Make sure you discuss any questions you have with your health care provider.

## 2014-02-16 NOTE — Telephone Encounter (Signed)
Pt want a copy of his labs result sent to him.

## 2014-02-17 ENCOUNTER — Telehealth: Payer: Self-pay | Admitting: Internal Medicine

## 2014-02-17 NOTE — Telephone Encounter (Signed)
Labs mailed

## 2014-02-17 NOTE — Telephone Encounter (Signed)
emmi mailed  °

## 2014-03-04 ENCOUNTER — Encounter: Payer: Self-pay | Admitting: Internal Medicine

## 2014-03-29 ENCOUNTER — Other Ambulatory Visit: Payer: Self-pay | Admitting: Internal Medicine

## 2014-05-03 ENCOUNTER — Other Ambulatory Visit: Payer: Self-pay | Admitting: Internal Medicine

## 2014-05-09 LAB — HM DIABETES EYE EXAM

## 2014-05-16 ENCOUNTER — Encounter: Payer: Self-pay | Admitting: Internal Medicine

## 2014-06-03 ENCOUNTER — Other Ambulatory Visit: Payer: Self-pay | Admitting: Internal Medicine

## 2014-06-06 ENCOUNTER — Other Ambulatory Visit: Payer: Self-pay | Admitting: Internal Medicine

## 2014-08-11 ENCOUNTER — Ambulatory Visit (INDEPENDENT_AMBULATORY_CARE_PROVIDER_SITE_OTHER): Payer: Medicare PPO | Admitting: Internal Medicine

## 2014-08-11 ENCOUNTER — Encounter: Payer: Self-pay | Admitting: Internal Medicine

## 2014-08-11 VITALS — BP 128/70 | HR 76 | Temp 97.9°F | Resp 20 | Ht 68.75 in | Wt 181.0 lb

## 2014-08-11 DIAGNOSIS — E0859 Diabetes mellitus due to underlying condition with other circulatory complications: Secondary | ICD-10-CM

## 2014-08-11 DIAGNOSIS — E7800 Pure hypercholesterolemia, unspecified: Secondary | ICD-10-CM

## 2014-08-11 DIAGNOSIS — I739 Peripheral vascular disease, unspecified: Secondary | ICD-10-CM | POA: Diagnosis not present

## 2014-08-11 DIAGNOSIS — I10 Essential (primary) hypertension: Secondary | ICD-10-CM | POA: Diagnosis not present

## 2014-08-11 LAB — HEMOGLOBIN A1C: HEMOGLOBIN A1C: 6.8 % — AB (ref 4.6–6.5)

## 2014-08-11 NOTE — Patient Instructions (Signed)
Limit your sodium (Salt) intake  Return in 6 months for follow-up  

## 2014-08-11 NOTE — Progress Notes (Signed)
Subjective:    Patient ID: Darryl Leonard., male    DOB: 08-11-1923, 81 y.o.   MRN: [id number]  HPI 81 year old patient who is seen for his six-month follow-up.  He has essential hypertension, dyslipidemia and type 2 diabetes.  He has PAD which has been stable.  No cardiopulmonary complaints or claudication.  Lab Results  Component Value Date   HGBA1C 6.6* 02/16/2014    Past Medical History  Diagnosis Date  . BENIGN PROSTATIC HYPERTROPHY 11/25/2007  . DIABETES MELLITUS, TYPE II 11/10/2006  . HYPERLIPIDEMIA 11/10/2006  . HYPERTENSION 12/13/2008  . OSTEOARTHRITIS 11/10/2006  . Rheumatic fever     History   Social History  . Marital Status: Married    Spouse Name: N/A  . Number of Children: N/A  . Years of Education: N/A   Occupational History  . Not on file.   Social History Main Topics  . Smoking status: Never Smoker   . Smokeless tobacco: Never Used  . Alcohol Use: Yes  . Drug Use: No  . Sexual Activity: Not on file   Other Topics Concern  . Not on file   Social History Narrative    Past Surgical History  Procedure Laterality Date  . Mastoid surgery    . Tendon repair      left foot  . Cataract extraction      Family History  Problem Relation Age of Onset  . Heart disease Mother   . Cancer Father     lung ca    No Known Allergies  Current Outpatient Prescriptions on File Prior to Visit  Medication Sig Dispense Refill  . aspirin 325 MG tablet Take 325 mg by mouth daily.    . benazepril-hydrochlorthiazide (LOTENSIN HCT) 10-12.5 MG per tablet TAKE 1 TABLET EVERY DAY 90 tablet 1  . Blood Glucose Monitoring Suppl (ONE TOUCH ULTRA MINI) W/DEVICE KIT 1 each by Other route daily as needed. 1 each 0  . glucose blood (ONE TOUCH ULTRA TEST) test strip 1 each by Other route daily as needed for other. 100 each 5  . ibuprofen (ADVIL,MOTRIN) 200 MG tablet Take 400 mg by mouth every 6 (six) hours as needed.    . metFORMIN (GLUCOPHAGE) 1000 MG tablet TAKE ONE  TABLET BY MOUTH TWICE DAILY WITH A MEAL 180 tablet 3  . ONETOUCH DELICA LANCETS FINE MISC 1 each by Other route daily as needed. 100 each 12  . pravastatin (PRAVACHOL) 20 MG tablet TAKE ONE TABLET BY MOUTH ONCE DAILY 90 tablet 0  . TRAVATAN Z 0.004 % SOLN ophthalmic solution Place 1 drop into both eyes at bedtime.      No current facility-administered medications on file prior to visit.    BP 128/70 mmHg  Pulse 76  Temp(Src) 97.9 F (36.6 C) (Oral)  Resp 20  Ht 5' 8.75" (1.746 m)  Wt 181 lb (82.101 kg)  BMI 26.93 kg/m2  SpO2 95%      Review of Systems  Constitutional: Positive for fever. Negative for chills, appetite change and fatigue.  HENT: Negative for congestion, dental problem, ear pain, hearing loss, sore throat, tinnitus, trouble swallowing and voice change.   Eyes: Negative for pain, discharge and visual disturbance.  Respiratory: Negative for cough, chest tightness, wheezing and stridor.   Cardiovascular: Negative for chest pain, palpitations and leg swelling.  Gastrointestinal: Negative for nausea, vomiting, abdominal pain, diarrhea, constipation, blood in stool and abdominal distention.  Genitourinary: Negative for urgency, hematuria, flank pain, discharge, difficulty urinating and genital  sores.  Musculoskeletal: Positive for arthralgias and gait problem. Negative for myalgias, back pain, joint swelling and neck stiffness.  Skin: Negative for rash.  Neurological: Positive for weakness. Negative for dizziness, syncope, speech difficulty, numbness and headaches.  Hematological: Negative for adenopathy. Does not bruise/bleed easily.  Psychiatric/Behavioral: Negative for behavioral problems and dysphoric mood. The patient is not nervous/anxious.        Objective:   Physical Exam  Constitutional: He is oriented to person, place, and time. He appears well-developed.  HENT:  Head: Normocephalic.  Right Ear: External ear normal.  Left Ear: External ear normal.  Eyes:  Conjunctivae and EOM are normal.  Neck: Normal range of motion.  Cardiovascular: Normal rate and normal heart sounds.   Pulmonary/Chest: Breath sounds normal.  Abdominal: Bowel sounds are normal.  Musculoskeletal: Normal range of motion. He exhibits no edema or tenderness.  Neurological: He is alert and oriented to person, place, and time.  Psychiatric: He has a normal mood and affect. His behavior is normal.          Assessment & Plan:  Diabetes mellitus.  Last hemoglobin A1c 6.6.  We'll repeat if this remains in a acceptable range.  We'll repeat in 6 months Essential hypertension.  Well-controlled Osteoarthritis, stable Dyslipidemia.  Continue statin therapy.  Recheck lipid profile in 6 months  CPX 6 months

## 2014-08-11 NOTE — Progress Notes (Signed)
Pre visit review using our clinic review tool, if applicable. No additional management support is needed unless otherwise documented below in the visit note. 

## 2014-08-18 ENCOUNTER — Ambulatory Visit: Payer: 59 | Admitting: Internal Medicine

## 2014-10-24 LAB — HM DIABETES EYE EXAM

## 2014-11-01 ENCOUNTER — Encounter: Payer: Self-pay | Admitting: Internal Medicine

## 2014-11-15 ENCOUNTER — Other Ambulatory Visit: Payer: Self-pay | Admitting: Internal Medicine

## 2014-12-05 ENCOUNTER — Other Ambulatory Visit: Payer: Self-pay | Admitting: Internal Medicine

## 2015-02-06 ENCOUNTER — Ambulatory Visit: Payer: Medicare PPO | Admitting: Internal Medicine

## 2015-02-07 ENCOUNTER — Encounter: Payer: Self-pay | Admitting: Internal Medicine

## 2015-02-07 ENCOUNTER — Ambulatory Visit (INDEPENDENT_AMBULATORY_CARE_PROVIDER_SITE_OTHER): Payer: Medicare PPO | Admitting: Internal Medicine

## 2015-02-07 VITALS — BP 140/70 | HR 72 | Temp 97.8°F | Resp 20 | Ht 68.75 in | Wt 182.0 lb

## 2015-02-07 DIAGNOSIS — E1140 Type 2 diabetes mellitus with diabetic neuropathy, unspecified: Secondary | ICD-10-CM | POA: Diagnosis not present

## 2015-02-07 DIAGNOSIS — I10 Essential (primary) hypertension: Secondary | ICD-10-CM | POA: Diagnosis not present

## 2015-02-07 DIAGNOSIS — I739 Peripheral vascular disease, unspecified: Secondary | ICD-10-CM | POA: Diagnosis not present

## 2015-02-07 DIAGNOSIS — E7800 Pure hypercholesterolemia, unspecified: Secondary | ICD-10-CM

## 2015-02-07 LAB — HEMOGLOBIN A1C: HEMOGLOBIN A1C: 6.5 % (ref 4.6–6.5)

## 2015-02-07 NOTE — Progress Notes (Signed)
Pre visit review using our clinic review tool, if applicable. No additional management support is needed unless otherwise documented below in the visit note. 

## 2015-02-07 NOTE — Patient Instructions (Addendum)
Limit your sodium (Salt) intake  Return in 6 months for follow-up and complete annual exam with your new physician at our [facility] location   Please check your hemoglobin A1c every 6  Months  Please check your blood pressure on a regular basis.  If it is consistently greater than 150/90, please make an office appointment.

## 2015-02-07 NOTE — Progress Notes (Signed)
Subjective:    Patient ID: Darryl Leonard., male    DOB: 1924-05-06, 81 y.o.   MRN: [id number]  HPI  81 year old patient who is seen today for his six-month follow-up.  He has a history of type 2 diabetes which has been managed with metformin therapy only.  Lab Results  Component Value Date   HGBA1C 6.8* 08/11/2014    He has essential hypertension and requires combination therapy which includes Ace inhibition.  He has peripheral vascular disease and remains on statin therapy.  He is doing quite well today.  Denies any cardiopulmonary complaints.  He lives close to [place] and also has a daughter who receives care at the [place] location.  For convenience, he wishes to switch providers to that location. Past Medical History  Diagnosis Date  . BENIGN PROSTATIC HYPERTROPHY 11/25/2007  . DIABETES MELLITUS, TYPE II 11/10/2006  . HYPERLIPIDEMIA 11/10/2006  . HYPERTENSION 12/13/2008  . OSTEOARTHRITIS 11/10/2006  . Rheumatic fever     Social History   Social History  . Marital Status: Married    Spouse Name: N/A  . Number of Children: N/A  . Years of Education: N/A   Occupational History  . Not on file.   Social History Main Topics  . Smoking status: Never Smoker   . Smokeless tobacco: Never Used  . Alcohol Use: Yes  . Drug Use: No  . Sexual Activity: Not on file   Other Topics Concern  . Not on file   Social History Narrative    Past Surgical History  Procedure Laterality Date  . Mastoid surgery    . Tendon repair      left foot  . Cataract extraction      Family History  Problem Relation Age of Onset  . Heart disease Mother   . Cancer Father     lung ca    No Known Allergies  Current Outpatient Prescriptions on File Prior to Visit  Medication Sig Dispense Refill  . aspirin 325 MG tablet Take 325 mg by mouth daily.    . benazepril-hydrochlorthiazide (LOTENSIN HCT) 10-12.5 MG per tablet TAKE 1 TABLET EVERY DAY 90 tablet 1  . ibuprofen (ADVIL,MOTRIN)  200 MG tablet Take 400 mg by mouth every 6 (six) hours as needed.    . metFORMIN (GLUCOPHAGE) 1000 MG tablet TAKE ONE TABLET BY MOUTH TWICE DAILY WITH A MEAL 180 tablet 3  . pravastatin (PRAVACHOL) 20 MG tablet TAKE ONE TABLET BY MOUTH ONCE DAILY 90 tablet 0  . TRAVATAN Z 0.004 % SOLN ophthalmic solution Place 1 drop into both eyes at bedtime.      No current facility-administered medications on file prior to visit.    BP 140/70 mmHg  Pulse 72  Temp(Src) 97.8 F (36.6 C) (Oral)  Resp 20  Ht 5' 8.75" (1.746 m)  Wt 182 lb (82.555 kg)  BMI 27.08 kg/m2  SpO2 98%      Review of Systems  Constitutional: Negative for fever, chills, appetite change and fatigue.  HENT: Negative for congestion, dental problem, ear pain, hearing loss, sore throat, tinnitus, trouble swallowing and voice change.   Eyes: Negative for pain, discharge and visual disturbance.  Respiratory: Negative for cough, chest tightness, wheezing and stridor.   Cardiovascular: Positive for leg swelling. Negative for chest pain and palpitations.  Gastrointestinal: Negative for nausea, vomiting, abdominal pain, diarrhea, constipation, blood in stool and abdominal distention.  Genitourinary: Negative for urgency, hematuria, flank pain, discharge, difficulty urinating and genital sores.  Musculoskeletal: Positive  for back pain, arthralgias and gait problem. Negative for myalgias, joint swelling and neck stiffness.  Skin: Negative for rash.  Neurological: Negative for dizziness, syncope, speech difficulty, weakness, numbness and headaches.  Hematological: Negative for adenopathy. Does not bruise/bleed easily.  Psychiatric/Behavioral: Negative for behavioral problems and dysphoric mood. The patient is not nervous/anxious.        Objective:   Physical Exam  Constitutional: He is oriented to person, place, and time. He appears well-developed.  Frail Unsteady Walks with a cane Blood pressure 132/72  HENT:  Head:  Normocephalic.  Right Ear: External ear normal.  Left Ear: External ear normal.  Eyes: Conjunctivae and EOM are normal.  Neck: Normal range of motion.  Cardiovascular: Normal rate and normal heart sounds.   Pedal pulses absent  Pulmonary/Chest: He has rales.  Crackles right base  Abdominal: Bowel sounds are normal.  Musculoskeletal: Normal range of motion. He exhibits edema. He exhibits no tenderness.  Plus 2 pedal edema  Neurological: He is alert and oriented to person, place, and time.  Psychiatric: He has a normal mood and affect. His behavior is normal.          Assessment & Plan:

## 2015-02-08 ENCOUNTER — Inpatient Hospital Stay
Admission: EM | Admit: 2015-02-08 | Discharge: 2015-02-10 | DRG: 309 | Disposition: A | Discharge status: Home / Self Care | Payer: Medicare PPO | Attending: Specialist | Admitting: Specialist

## 2015-02-08 ENCOUNTER — Emergency Department: Payer: Medicare PPO

## 2015-02-08 ENCOUNTER — Encounter: Payer: Self-pay | Admitting: Emergency Medicine

## 2015-02-08 DIAGNOSIS — H409 Unspecified glaucoma: Secondary | ICD-10-CM | POA: Diagnosis present

## 2015-02-08 DIAGNOSIS — N400 Enlarged prostate without lower urinary tract symptoms: Secondary | ICD-10-CM | POA: Diagnosis present

## 2015-02-08 DIAGNOSIS — E1151 Type 2 diabetes mellitus with diabetic peripheral angiopathy without gangrene: Secondary | ICD-10-CM | POA: Diagnosis present

## 2015-02-08 DIAGNOSIS — I872 Venous insufficiency (chronic) (peripheral): Secondary | ICD-10-CM | POA: Diagnosis present

## 2015-02-08 DIAGNOSIS — Z7982 Long term (current) use of aspirin: Secondary | ICD-10-CM

## 2015-02-08 DIAGNOSIS — M810 Age-related osteoporosis without current pathological fracture: Secondary | ICD-10-CM | POA: Diagnosis present

## 2015-02-08 DIAGNOSIS — E785 Hyperlipidemia, unspecified: Secondary | ICD-10-CM | POA: Diagnosis present

## 2015-02-08 DIAGNOSIS — L97929 Non-pressure chronic ulcer of unspecified part of left lower leg with unspecified severity: Secondary | ICD-10-CM | POA: Diagnosis present

## 2015-02-08 DIAGNOSIS — I739 Peripheral vascular disease, unspecified: Secondary | ICD-10-CM | POA: Diagnosis present

## 2015-02-08 DIAGNOSIS — M1990 Unspecified osteoarthritis, unspecified site: Secondary | ICD-10-CM | POA: Diagnosis present

## 2015-02-08 DIAGNOSIS — Z79899 Other long term (current) drug therapy: Secondary | ICD-10-CM | POA: Diagnosis not present

## 2015-02-08 DIAGNOSIS — R51 Headache: Secondary | ICD-10-CM | POA: Diagnosis present

## 2015-02-08 DIAGNOSIS — Z794 Long term (current) use of insulin: Secondary | ICD-10-CM | POA: Diagnosis not present

## 2015-02-08 DIAGNOSIS — I10 Essential (primary) hypertension: Secondary | ICD-10-CM | POA: Diagnosis present

## 2015-02-08 DIAGNOSIS — R079 Chest pain, unspecified: Secondary | ICD-10-CM | POA: Diagnosis not present

## 2015-02-08 DIAGNOSIS — I200 Unstable angina: Secondary | ICD-10-CM | POA: Diagnosis present

## 2015-02-08 DIAGNOSIS — E780 Pure hypercholesterolemia: Secondary | ICD-10-CM | POA: Diagnosis present

## 2015-02-08 DIAGNOSIS — I4891 Unspecified atrial fibrillation: Principal | ICD-10-CM | POA: Diagnosis present

## 2015-02-08 DIAGNOSIS — J449 Chronic obstructive pulmonary disease, unspecified: Secondary | ICD-10-CM | POA: Diagnosis present

## 2015-02-08 DIAGNOSIS — L97529 Non-pressure chronic ulcer of other part of left foot with unspecified severity: Secondary | ICD-10-CM | POA: Diagnosis present

## 2015-02-08 DIAGNOSIS — E119 Type 2 diabetes mellitus without complications: Secondary | ICD-10-CM

## 2015-02-08 HISTORY — DX: Peripheral vascular disease, unspecified: I73.9

## 2015-02-08 LAB — COMPREHENSIVE METABOLIC PANEL
ALBUMIN: 4.1 g/dL (ref 3.5–5.0)
ALK PHOS: 71 U/L (ref 38–126)
ALT: 6 U/L — AB (ref 17–63)
AST: 20 U/L (ref 15–41)
Anion gap: 9 (ref 5–15)
BUN: 30 mg/dL — ABNORMAL HIGH (ref 6–20)
CALCIUM: 8.7 mg/dL — AB (ref 8.9–10.3)
CHLORIDE: 101 mmol/L (ref 101–111)
CO2: 26 mmol/L (ref 22–32)
CREATININE: 1.5 mg/dL — AB (ref 0.61–1.24)
GFR calc Af Amer: 46 mL/min — ABNORMAL LOW (ref >60)
GFR calc non Af Amer: 40 mL/min — ABNORMAL LOW (ref >60)
GLUCOSE: 157 mg/dL — AB (ref 65–99)
Potassium: 4.2 mmol/L (ref 3.5–5.1)
SODIUM: 136 mmol/L (ref 135–145)
Total Bilirubin: 0.5 mg/dL (ref 0.3–1.2)
Total Protein: 7 g/dL (ref 6.5–8.1)

## 2015-02-08 LAB — DIFFERENTIAL
BASOS ABS: 0 10*3/uL (ref 0–0.1)
BASOS PCT: 1 %
Eosinophils Absolute: 0.1 10*3/uL (ref 0–0.7)
Eosinophils Relative: 1 %
LYMPHS PCT: 9 %
Lymphs Abs: 0.9 10*3/uL — ABNORMAL LOW (ref 1.0–3.6)
Monocytes Absolute: 0.8 10*3/uL (ref 0.2–1.0)
Monocytes Relative: 8 %
NEUTROS ABS: 8.3 10*3/uL — AB (ref 1.4–6.5)
NEUTROS PCT: 81 %

## 2015-02-08 LAB — CBC
HCT: 34.7 % — ABNORMAL LOW (ref 40.0–52.0)
Hemoglobin: 11.6 g/dL — ABNORMAL LOW (ref 13.0–18.0)
MCH: 30.7 pg (ref 26.0–34.0)
MCHC: 33.4 g/dL (ref 32.0–36.0)
MCV: 92 fL (ref 80.0–100.0)
PLATELETS: 248 10*3/uL (ref 150–440)
RBC: 3.78 MIL/uL — AB (ref 4.40–5.90)
RDW: 13.9 % (ref 11.5–14.5)
WBC: 10.1 10*3/uL (ref 3.8–10.6)

## 2015-02-08 LAB — APTT: APTT: 32 s (ref 24–36)

## 2015-02-08 LAB — PROTIME-INR
INR: 0.96
PROTHROMBIN TIME: 13 s (ref 11.4–15.0)

## 2015-02-08 LAB — TROPONIN I: Troponin I: <0.03 ng/mL (ref <0.031)

## 2015-02-08 MED ORDER — ACETAMINOPHEN 325 MG PO TABS
650.0000 mg | ORAL_TABLET | Freq: Once | ORAL | Status: AC
  Administered 2015-02-08: 650 mg via ORAL
  Filled 2015-02-08: qty 2

## 2015-02-08 MED ORDER — DILTIAZEM HCL 100 MG IV SOLR
5.0000 mg/h | Freq: Once | INTRAVENOUS | Status: AC
  Administered 2015-02-08: 5 mg/h via INTRAVENOUS
  Filled 2015-02-08: qty 100

## 2015-02-08 MED ORDER — NITROGLYCERIN 2 % TD OINT
0.5000 [in_us] | TOPICAL_OINTMENT | Freq: Once | TRANSDERMAL | Status: DC
  Administered 2015-02-09: 0.5 [in_us] via TOPICAL
  Filled 2015-02-08: qty 1

## 2015-02-08 MED ORDER — LABETALOL HCL 5 MG/ML IV SOLN: 10.0000 mg | Freq: Once | INTRAVENOUS | Status: DC

## 2015-02-08 NOTE — ED Notes (Addendum)
Patient to ER for c/o chest pain, headache, and HTN. Patient states "I started feeling horrible about 1pm today". Patient states he woke up late this am and had breakfast at approx 1130. Patient states "I just feel awful". Patient walked to restroom with one assist without difficulty. States he feels like he is walking and moving like he normally does. Denies any unilateral weakness or slurred speech at any time. Patient denies feeling like this ever before.

## 2015-02-08 NOTE — ED Notes (Signed)
Pt comes into the ED via EMS from home with c/o chest pain, HA with HTN that started today

## 2015-02-08 NOTE — ED Provider Notes (Signed)
[facility] Emergency Department Darryl Leonard Note  ____________________________________________  Time seen: Approximately 8:47 PM  I have reviewed the triage vital signs and the nursing notes.   HISTORY  Chief Complaint Chest Pain; Hypertension; and Headache    HPI Darryl Leonard. is a 81 y.o. male presents today complaining of having had elevated blood pressure, chest pain and a headache. He states that he was in his normal state of health until about one. He states that he began to have substernal chest pain that radiated to the left jaw but not to the back. He also then had a gradual onset headache. He states he has not had any nausea or vomiting or shortness of breath. He denies any fever or chills. EMS saw him and gave him nitroglycerin and his chest pain is nearly gone. His headache as well as well is gone. He states that he had no numbness or weakness. He states that he has never felt like that before. He states that the pain was not tearing and did not radiate to the back. His blood pressure of apparently over 200. EMS did give him nitroglycerin and his pressure came down to the 150s, he also received aspirin, a full aspirin, from his wife today.  Past Medical History  Diagnosis Date  . BENIGN PROSTATIC HYPERTROPHY 11/25/2007  . DIABETES MELLITUS, TYPE II 11/10/2006  . HYPERLIPIDEMIA 11/10/2006  . HYPERTENSION 12/13/2008  . OSTEOARTHRITIS 11/10/2006  . Rheumatic fever     Patient Active Problem List   Diagnosis Date Noted  . Peripheral vascular disease 07/30/2011  . Essential hypertension 12/13/2008  . BENIGN PROSTATIC HYPERTROPHY 11/25/2007  . Diabetes 11/10/2006  . Pure hypercholesterolemia 11/10/2006  . Osteoarthritis 11/10/2006    Past Surgical History  Procedure Laterality Date  . Mastoid surgery    . Tendon repair      left foot  . Cataract extraction      Current Outpatient Rx  Name  Route  Sig  Dispense  Refill  . aspirin EC 325 MG  tablet   Oral   Take 325 mg by mouth daily.         . benazepril-hydrochlorthiazide (LOTENSIN HCT) 10-12.5 MG tablet   Oral   Take 1 tablet by mouth daily.         Marland Kitchen ibuprofen (ADVIL,MOTRIN) 200 MG tablet   Oral   Take 400 mg by mouth every 6 (six) hours as needed for mild pain.          . metFORMIN (GLUCOPHAGE) 1000 MG tablet   Oral   Take 1,000 mg by mouth 2 (two) times daily with a meal.         . pravastatin (PRAVACHOL) 20 MG tablet   Oral   Take 20 mg by mouth at bedtime.         . TRAVATAN Z 0.004 % SOLN ophthalmic solution   Both Eyes   Place 1 drop into both eyes at bedtime.            Allergies Review of patient's allergies indicates no known allergies.  Family History  Problem Relation Age of Onset  . Heart disease Mother   . Cancer Father     lung ca    Social History Social History  Substance Use Topics  . Smoking status: Never Smoker   . Smokeless tobacco: Never Used  . Alcohol Use: Yes    Review of Systems Constitutional: No fever/chills Eyes: No visual changes. ENT: No sore throat.  No stiff neck no neck pain Cardiovascular: Denies chest pain. Respiratory: Denies shortness of breath. Gastrointestinal:   no vomiting.  No diarrhea.  No constipation. Genitourinary: Negative for dysuria. Musculoskeletal: Negative lower extremity swelling Skin: Negative for rash. Neurological: Negative for headaches, focal weakness or numbness. 10-point ROS otherwise negative.  ____________________________________________   PHYSICAL EXAM:  VITAL SIGNS: ED Triage Vitals  Enc Vitals Group     BP 02/08/15 1743 161/113 mmHg     Pulse Rate 02/08/15 1743 92     Resp 02/08/15 1800 24     Temp 02/08/15 1743 98.2 F (36.8 C)     Temp Source 02/08/15 1743 Oral     SpO2 02/08/15 1743 95 %     Weight 02/08/15 1743 200 lb (90.719 kg)     Height 02/08/15 1743 6' (1.829 m)     Head Cir --      Peak Flow --      Pain Score --      Pain Loc --       Pain Edu? --      Excl. in GC? --     Constitutional: Alert and oriented. Well appearing and in no acute distress. Eyes: Conjunctivae are normal. PERRL. EOMI. Head: Atraumatic. Nose: No congestion/rhinnorhea. Mouth/Throat: Mucous membranes are moist.  Oropharynx non-erythematous. Neck: No stridor.   Nontender with no meningismus Cardiovascular: Normal rate, regular rhythm. Grossly normal heart sounds.  Good peripheral circulation. Respiratory: Normal respiratory effort.  No retractions. Lungs CTAB. Gastrointestinal: Soft and nontender. No distention. No guarding no rebound Back:  There is no focal tenderness or step off there is no midline tenderness there are no lesions noted. there is no CVA tenderness Musculoskeletal: No lower extremity tenderness. No joint effusions, no DVT signs strong distal pulses no edema Neurologic:  Normal speech and language. No gross focal neurologic deficits are appreciated.  Skin:  Skin is warm, dry and intact. No rash noted. Psychiatric: Mood and affect are normal. Speech and behavior are normal.  ____________________________________________   LABS (all labs ordered are listed, but only abnormal results are displayed)  Labs Reviewed  CBC - Abnormal; Notable for the following:    RBC 3.78 (*)    Hemoglobin 11.6 (*)    HCT 34.7 (*)    All other components within normal limits  DIFFERENTIAL - Abnormal; Notable for the following:    Neutro Abs 8.3 (*)    Lymphs Abs 0.9 (*)    All other components within normal limits  COMPREHENSIVE METABOLIC PANEL - Abnormal; Notable for the following:    Glucose, Bld 157 (*)    BUN 30 (*)    Creatinine, Ser 1.50 (*)    Calcium 8.7 (*)    ALT 6 (*)    GFR calc non Af Amer 40 (*)    GFR calc Af Amer 46 (*)    All other components within normal limits  PROTIME-INR  APTT  TROPONIN I  TROPONIN I   ____________________________________________  EKG I personally reviewed the EKG, no acute ischemic  changes ____________________________________________  RADIOLOGY  I personally reviewed x-rays ____________________________________________   PROCEDURES  Procedure(s) performed: None  Critical Care performed: None  ____________________________________________   INITIAL IMPRESSION / ASSESSMENT AND PLAN / ED COURSE  Pertinent labs & imaging results that were available during my care of the patient were reviewed by me and considered in my medical decision making (see chart for details).  Patient with headache and elevated blood pressure as well as chest  pain today. Unclear why that happened. His creatinine is 1.5, which would make a CT scan possibly dangerous for him. He did not have pain going to his back he has no neurologic symptoms and I have low suspicion for dissection at this time. The patient initial troponin was negative we are sending a second troponin. This time he is not complaining of chest pain or headache and he states he feels pretty good. Certainly this could've just been hypertensive urgency with elevated blood pressure headache and chest pain. However we have ordered Nitropaste and Tylenol, patient remains neurologically intact. He has good pulses. I do detect on the monitor that he appears possibly to have gone into atrial fibrillation at this time we will repeat the EKG he has been admitted to the hospitalist service.   ----------------------------------------- 8:57 PM on 02/08/2015 -----------------------------------------  Patient now in A. fib, no acute ischemia noted on EKG. Repeat EKG shows A. fib rate 134 no acute ST elevation or depression normal axis. Hospitalist aware. Patient any rate control medication if he stays in this. We are rechecking blood pressure ____________________________________________   FINAL CLINICAL IMPRESSION(S) / ED DIAGNOSES  Final diagnoses:  Chest pain, unspecified chest pain type     Jeanmarie Plant, MD 02/08/15 [phone]

## 2015-02-08 NOTE — H&P (Signed)
[facility] - [place] at [facility]   PATIENT NAME: Darryl Leonard    MR#:  [id number]  DATE OF BIRTH:  Feb 07, 1924  DATE OF ADMISSION:  02/08/2015  PRIMARY CARE PHYSICIAN: Darryl Boga, MD   REQUESTING/REFERRING PHYSICIAN: Alphonzo Lemmings, MD  CHIEF COMPLAINT:   Chief Complaint  Patient presents with  . Chest Pain  . Hypertension  . Headache    HISTORY OF PRESENT ILLNESS:  Darryl Leonard  is a 81 y.o. male who presents with chest pain. Patient states that he began having chest pain earlier today while at rest. He has difficulty characterizing the chest pain, but he does state that he felt like it moved up towards his face and jaw. He states that the pain is worst with deep inspiration, but was present regardless of this factor. Pain did improve some with nitroglycerin given to him by EMS. In the ED, cardiac enzymes were negative, EKG appeared nonischemic. However, patient was noted to be in atrial fibrillation, and states things in the room and given this diagnosis in the past. Hospitalists were called for admission for unstable angina and new onset A. fib.  PAST MEDICAL HISTORY:   Past Medical History  Diagnosis Date  . BENIGN PROSTATIC HYPERTROPHY 11/25/2007  . DIABETES MELLITUS, TYPE II 11/10/2006  . HYPERLIPIDEMIA 11/10/2006  . HYPERTENSION 12/13/2008  . OSTEOARTHRITIS 11/10/2006  . Rheumatic fever     PAST SURGICAL HISTORY:   Past Surgical History  Procedure Laterality Date  . Mastoid surgery    . Tendon repair      left foot  . Cataract extraction      SOCIAL HISTORY:   Social History  Substance Use Topics  . Smoking status: Never Smoker   . Smokeless tobacco: Never Used  . Alcohol Use: Yes    FAMILY HISTORY:   Family History  Problem Relation Age of Onset  . Heart disease Mother   . Cancer Father     lung ca    DRUG ALLERGIES:  No Known Allergies  MEDICATIONS AT HOME:   Prior to Admission medications   Medication Sig  Start Date End Date Taking? Authorizing Provider  aspirin EC 325 MG tablet Take 325 mg by mouth daily.   Yes Historical Provider, MD  benazepril-hydrochlorthiazide (LOTENSIN HCT) 10-12.5 MG tablet Take 1 tablet by mouth daily.   Yes Historical Provider, MD  ibuprofen (ADVIL,MOTRIN) 200 MG tablet Take 400 mg by mouth every 6 (six) hours as needed for mild pain.    Yes Historical Provider, MD  metFORMIN (GLUCOPHAGE) 1000 MG tablet Take 1,000 mg by mouth 2 (two) times daily with a meal.   Yes Historical Provider, MD  pravastatin (PRAVACHOL) 20 MG tablet Take 20 mg by mouth at bedtime.   Yes Historical Provider, MD  TRAVATAN Z 0.004 % SOLN ophthalmic solution Place 1 drop into both eyes at bedtime.    Yes Historical Provider, MD    REVIEW OF SYSTEMS:  Review of Systems  Constitutional: Negative for fever, chills, weight loss and malaise/fatigue.  HENT: Negative for ear pain, hearing loss and tinnitus.   Eyes: Negative for blurred vision, double vision, pain and redness.  Respiratory: Positive for shortness of breath. Negative for cough and hemoptysis.   Cardiovascular: Positive for chest pain. Negative for palpitations, orthopnea and leg swelling.  Gastrointestinal: Negative for nausea, vomiting, abdominal pain, diarrhea and constipation.  Genitourinary: Negative for dysuria, frequency and hematuria.  Musculoskeletal: Negative for back pain, joint pain and neck pain.  Skin:  No acne, rash, or lesions  Neurological: Negative for dizziness, tremors, focal weakness and weakness.  Endo/Heme/Allergies: Negative for polydipsia. Does not bruise/bleed easily.  Psychiatric/Behavioral: Negative for depression. The patient is not nervous/anxious and does not have insomnia.      VITAL SIGNS:   Filed Vitals:   02/08/15 1743 02/08/15 1800 02/08/15 2109 02/08/15 2114  BP: 161/113 166/98 138/91 144/99  Pulse: 92 91 122 89  Temp: 98.2 F (36.8 C)     TempSrc: Oral     Resp:  Height: 6'  (1.829 m)     Weight: 90.719 kg (200 lb)     SpO2: 95% 96% 99% 98%   Wt Readings from Last 3 Encounters:  02/08/15 90.719 kg (200 lb)  02/07/15 82.555 kg (182 lb)  08/11/14 82.101 kg (181 lb)    PHYSICAL EXAMINATION:  Physical Exam  Vitals reviewed. Constitutional: He is oriented to person, place, and time. He appears well-developed and well-nourished. No distress.  HENT:  Head: Normocephalic and atraumatic.  Mouth/Throat: Oropharynx is clear and moist.  Eyes: Conjunctivae and EOM are normal. Pupils are equal, round, and reactive to light. No scleral icterus.  Neck: Normal range of motion. Neck supple. No JVD present. No thyromegaly present.  Cardiovascular: Intact distal pulses.  Exam reveals no gallop and no friction rub.   No murmur heard. Tachycardic, irregular rhythm  Respiratory: Effort normal. No respiratory distress. He has no wheezes. He has rales (mild bibasilar crackles).  GI: Soft. Bowel sounds are normal. He exhibits no distension. There is no tenderness.  Musculoskeletal: Normal range of motion. He exhibits no edema.  No arthritis, no gout  Lymphadenopathy:    He has no cervical adenopathy.  Neurological: He is alert and oriented to person, place, and time. No cranial nerve deficit.  No dysarthria, no aphasia  Skin: Skin is warm and dry. No rash noted. No erythema.  Psychiatric: He has a normal mood and affect. His behavior is normal. Judgment and thought content normal.    LABORATORY PANEL:   CBC  Recent Labs Lab 02/08/15 1740  WBC 10.1  HGB 11.6*  HCT 34.7*  PLT 248   ------------------------------------------------------------------------------------------------------------------  Chemistries   Recent Labs Lab 02/08/15 1740  NA 136  K 4.2  CL 101  CO2 26  GLUCOSE 157*  BUN 30*  CREATININE 1.50*  CALCIUM 8.7*  AST 20  ALT 6*  ALKPHOS 71  BILITOT 0.5    ------------------------------------------------------------------------------------------------------------------  Cardiac Enzymes  Recent Labs Lab 02/08/15 1740  TROPONINI <0.03   ------------------------------------------------------------------------------------------------------------------  RADIOLOGY:  Dg Chest 2 View  02/08/2015   CLINICAL DATA:  Chest pain.  EXAM: CHEST  2 VIEW  COMPARISON:  None.  FINDINGS: Normal heart size. Mild to moderate tortuosity of the atherosclerotic thoracic aorta. No pneumothorax. No pleural effusion. Lungs are mildly hyperinflated. No pulmonary edema. No focal lung consolidation to suggest a pneumonia. There are mild peripheral basilar reticular interstitial opacities in both lungs. Moderate degenerative changes are present in the thoracic spine.  IMPRESSION: 1. Mildly hyperinflated lungs, suggesting COPD. 2. Mild peripheral basilar reticular interstitial opacities, suggesting interstitial lung disease. If clinically warranted, this can be further evaluated with a high-resolution protocol chest CT study.   Electronically Signed   By: Delbert Phenix M.D.   On: 02/08/2015 19:00   Ct Head Wo Contrast  02/08/2015   CLINICAL DATA:  Headache starting today.  EXAM: CT HEAD WITHOUT CONTRAST  TECHNIQUE: Contiguous axial images were obtained from the  base of the skull through the vertex without intravenous contrast.  COMPARISON:  None.  FINDINGS: There is chronic diffuse atrophy. There is chronic bilateral periventricular white matter small vessel ischemic change. There is no midline shift, hydrocephalus, or mass. No acute hemorrhage or acute transcortical infarct is identified. The bony calvarium is intact. The visualized sinuses are clear.  IMPRESSION: No focal acute intracranial abnormality identified.  Chronic diffuse atrophy.   Electronically Signed   By: Sherian Rein M.D.   On: 02/08/2015 18:55    EKG:   Orders placed or performed during the hospital  encounter of 02/08/15  . ED EKG  . ED EKG  . EKG 12-Lead  . EKG 12-Lead    IMPRESSION AND PLAN:  Principal Problem:   Unstable angina - cardiac enzymes negative so far, pain is improved with nitrates, we'll admit and trend his cardiac enzymes, get an echocardiogram, get a cardiology consult, and treat his A. fib as below. Active Problems:   Atrial fibrillation with RVR - started the patient on Cardizem drip without a bolus in order to prevent hypotension with concurrent administration of the nitrates that he got. Cardiology consult as above.   Diabetes - sliding scale insulin with appropriate fingerstick glucose checks every 6 hours on by mouth, then before meals at bedtime heart healthy/carb modified diet afterwards.   Essential hypertension - was elevated on admission, has been coming down with medication administration, we'll continue to monitor and continue home meds if needed, though for now he'll be on a Cardizem drip so we will wait to see how his blood pressure response to this treatment.   HLD (hyperlipidemia) - stable, continue home meds     All the records are reviewed and case discussed with ED provider. Management plans discussed with the patient and/or family.  DVT PROPHYLAXIS: SubQ lovenox  ADMISSION STATUS: Inpatient  CODE STATUS: Full Advance Directive Documentation        Most Recent Value   Type of Advance Directive  Healthcare Power of Attorney   Pre-existing out of facility DNR order (yellow form or pink MOST form)     "MOST" Form in Place?        TOTAL TIME TAKING CARE OF THIS PATIENT: 50 minutes.    WILLIS, DAVID FIELDING 02/08/2015, 9:27 PM  [facility]  Office  [phone]  CC: Primary care physician; Darryl Boga, MD

## 2015-02-09 ENCOUNTER — Encounter: Payer: Self-pay | Admitting: Physician Assistant

## 2015-02-09 ENCOUNTER — Inpatient Hospital Stay (HOSPITAL_COMMUNITY)
Admit: 2015-02-09 | Discharge: 2015-02-09 | Disposition: A | Discharge status: Home / Self Care | Payer: Medicare PPO | Attending: Internal Medicine | Admitting: Internal Medicine

## 2015-02-09 DIAGNOSIS — I4891 Unspecified atrial fibrillation: Secondary | ICD-10-CM

## 2015-02-09 DIAGNOSIS — I200 Unstable angina: Secondary | ICD-10-CM

## 2015-02-09 LAB — BASIC METABOLIC PANEL
Anion gap: 7 (ref 5–15)
BUN: 31 mg/dL — AB (ref 6–20)
CALCIUM: 8.4 mg/dL — AB (ref 8.9–10.3)
CHLORIDE: 103 mmol/L (ref 101–111)
CO2: 27 mmol/L (ref 22–32)
CREATININE: 1.66 mg/dL — AB (ref 0.61–1.24)
GFR, EST AFRICAN AMERICAN: 41 mL/min — AB (ref >60)
GFR, EST NON AFRICAN AMERICAN: 35 mL/min — AB (ref >60)
Glucose, Bld: 147 mg/dL — ABNORMAL HIGH (ref 65–99)
Potassium: 4.3 mmol/L (ref 3.5–5.1)
SODIUM: 137 mmol/L (ref 135–145)

## 2015-02-09 LAB — GLUCOSE, CAPILLARY
GLUCOSE-CAPILLARY: 120 mg/dL — AB (ref 65–99)
Glucose-Capillary: 131 mg/dL — ABNORMAL HIGH (ref 65–99)
Glucose-Capillary: 132 mg/dL — ABNORMAL HIGH (ref 65–99)
Glucose-Capillary: 143 mg/dL — ABNORMAL HIGH (ref 65–99)

## 2015-02-09 LAB — CBC
HCT: 32.1 % — ABNORMAL LOW (ref 40.0–52.0)
Hemoglobin: 10.6 g/dL — ABNORMAL LOW (ref 13.0–18.0)
MCH: 30.2 pg (ref 26.0–34.0)
MCHC: 32.9 g/dL (ref 32.0–36.0)
MCV: 92 fL (ref 80.0–100.0)
PLATELETS: 213 10*3/uL (ref 150–440)
RBC: 3.49 MIL/uL — AB (ref 4.40–5.90)
RDW: 14 % (ref 11.5–14.5)
WBC: 9.7 10*3/uL (ref 3.8–10.6)

## 2015-02-09 LAB — TROPONIN I
TROPONIN I: 0.07 ng/mL — AB (ref <0.031)
Troponin I: 0.03 ng/mL (ref <0.031)

## 2015-02-09 LAB — HEMOGLOBIN A1C: HEMOGLOBIN A1C: 6 % (ref 4.0–6.0)

## 2015-02-09 LAB — MRSA PCR SCREENING: MRSA by PCR: NEGATIVE

## 2015-02-09 MED ORDER — SODIUM CHLORIDE 0.9 % IV SOLN
INTRAVENOUS | Status: AC
  Administered 2015-02-09: 01:00:00 via INTRAVENOUS

## 2015-02-09 MED ORDER — LATANOPROST 0.005 % OP SOLN
1.0000 [drp] | Freq: Every day | OPHTHALMIC | Status: DC
  Administered 2015-02-09 (×2): 1 [drp] via OPHTHALMIC
  Filled 2015-02-09: qty 2.5

## 2015-02-09 MED ORDER — MORPHINE SULFATE (PF) 2 MG/ML IV SOLN: 2.0000 mg | INTRAVENOUS | Status: DC | PRN

## 2015-02-09 MED ORDER — SODIUM CHLORIDE 0.9 % IJ SOLN
3.0000 mL | Freq: Two times a day (BID) | INTRAMUSCULAR | Status: DC
  Administered 2015-02-09 (×3): 3 mL via INTRAVENOUS

## 2015-02-09 MED ORDER — METOPROLOL TARTRATE 25 MG PO TABS
25.0000 mg | ORAL_TABLET | Freq: Four times a day (QID) | ORAL | Status: DC
  Administered 2015-02-09: 25 mg via ORAL
  Filled 2015-02-09: qty 1

## 2015-02-09 MED ORDER — ENOXAPARIN SODIUM 40 MG/0.4ML ~~LOC~~ SOLN
40.0000 mg | Freq: Every day | SUBCUTANEOUS | Status: DC
  Administered 2015-02-09: 40 mg via SUBCUTANEOUS
  Filled 2015-02-09: qty 0.4

## 2015-02-09 MED ORDER — ONDANSETRON HCL 4 MG PO TABS: 4.0000 mg | ORAL_TABLET | Freq: Four times a day (QID) | ORAL | Status: DC | PRN

## 2015-02-09 MED ORDER — ONDANSETRON HCL 4 MG/2ML IJ SOLN: 4.0000 mg | Freq: Four times a day (QID) | INTRAMUSCULAR | Status: DC | PRN

## 2015-02-09 MED ORDER — APIXABAN 5 MG PO TABS
2.5000 mg | ORAL_TABLET | Freq: Two times a day (BID) | ORAL | Status: DC
  Administered 2015-02-09: 2.5 mg via ORAL
  Filled 2015-02-09: qty 1

## 2015-02-09 MED ORDER — DEXTROSE 5 % IV SOLN: 5.0000 mg/h | Freq: Once | INTRAVENOUS | Status: DC

## 2015-02-09 MED ORDER — PRAVASTATIN SODIUM 20 MG PO TABS
20.0000 mg | ORAL_TABLET | Freq: Every day | ORAL | Status: DC
  Administered 2015-02-09: 20 mg via ORAL
  Filled 2015-02-09: qty 1

## 2015-02-09 MED ORDER — COLLAGENASE 250 UNIT/GM EX OINT
TOPICAL_OINTMENT | Freq: Every day | CUTANEOUS | Status: DC
  Administered 2015-02-09: 17:00:00 via TOPICAL
  Administered 2015-02-10: 1 via TOPICAL
  Filled 2015-02-09: qty 30

## 2015-02-09 MED ORDER — INSULIN ASPART 100 UNIT/ML ~~LOC~~ SOLN
0.0000 [IU] | Freq: Four times a day (QID) | SUBCUTANEOUS | Status: DC
  Administered 2015-02-09 – 2015-02-10 (×5): 1 [IU] via SUBCUTANEOUS
  Filled 2015-02-09 (×5): qty 1

## 2015-02-09 MED ORDER — DILTIAZEM HCL 100 MG IV SOLR
5.0000 mg/h | INTRAVENOUS | Status: DC
  Administered 2015-02-09: 10 mg/h via INTRAVENOUS
  Filled 2015-02-09: qty 100

## 2015-02-09 MED ORDER — ACETAMINOPHEN 650 MG RE SUPP: 650.0000 mg | Freq: Four times a day (QID) | RECTAL | Status: DC | PRN

## 2015-02-09 MED ORDER — ASPIRIN EC 325 MG PO TBEC
325.0000 mg | DELAYED_RELEASE_TABLET | Freq: Every day | ORAL | Status: DC
  Administered 2015-02-09 – 2015-02-10 (×2): 325 mg via ORAL
  Filled 2015-02-09 (×2): qty 1

## 2015-02-09 MED ORDER — ACETAMINOPHEN 325 MG PO TABS: 650.0000 mg | ORAL_TABLET | Freq: Four times a day (QID) | ORAL | Status: DC | PRN

## 2015-02-09 MED ORDER — METOPROLOL TARTRATE 25 MG PO TABS
25.0000 mg | ORAL_TABLET | Freq: Two times a day (BID) | ORAL | Status: DC
  Administered 2015-02-10: 25 mg via ORAL
  Filled 2015-02-09: qty 1

## 2015-02-09 MED ORDER — DILTIAZEM HCL 30 MG PO TABS
30.0000 mg | ORAL_TABLET | Freq: Four times a day (QID) | ORAL | Status: DC
  Administered 2015-02-09 – 2015-02-10 (×5): 30 mg via ORAL
  Filled 2015-02-09 (×5): qty 1

## 2015-02-09 NOTE — Consult Note (Signed)
ANTICOAGULATION CONSULT NOTE - Initial Consult  Pharmacy Consult for apixaban Indication: atrial fibrillation  No Known Allergies  Patient Measurements: Height: 6' (182.9 cm) Weight: 178 lb 5.6 oz (80.9 kg) IBW/kg (Calculated) : 77.6 Heparin Dosing Weight:   Vital Signs: Temp: 97.8 F (36.6 C) (09/29 0800) Temp Source: Oral (09/29 0800) BP: 105/54 mmHg (09/29 1400) Pulse Rate: 72 (09/29 1400)  Labs:  Recent Labs  02/08/15 1740 02/08/15 2120 02/09/15 0428 02/09/15 1240  HGB 11.6*  --  10.6*  --   HCT 34.7*  --  32.1*  --   PLT 248  --  213  --   APTT 32  --   --   --   LABPROT 13.0  --   --   --   INR 0.96  --   --   --   CREATININE 1.50*  --  1.66*  --   TROPONINI <0.03 <0.03 0.03 0.07*    Estimated Creatinine Clearance: 33.1 mL/min (by C-G formula based on Cr of 1.66).   Medical History: Past Medical History  Diagnosis Date  . BENIGN PROSTATIC HYPERTROPHY 11/25/2007  . DIABETES MELLITUS, TYPE II 11/10/2006  . HYPERLIPIDEMIA 11/10/2006  . HYPERTENSION 12/13/2008  . OSTEOARTHRITIS 11/10/2006  . Rheumatic fever   . PVD (peripheral vascular disease)     Medications:  Scheduled:  . aspirin EC  325 mg Oral Daily  . collagenase   Topical Daily  . diltiazem  30 mg Oral 4 times per day  . enoxaparin (LOVENOX) injection  40 mg Subcutaneous QHS  . insulin aspart  0-9 Units Subcutaneous Q6H  . latanoprost  1 drop Both Eyes QHS  . metoprolol tartrate  25 mg Oral Q6H  . pravastatin  20 mg Oral QHS  . sodium chloride  3 mL Intravenous Q12H    Assessment: Pt is a 81 year old male with new onset afib. Chads score of 5. Pharmacy consulted to dose apixaban.  Goal of Therapy:   Monitor platelets by anticoagulation protocol: Yes   Plan:  Pt is >83 years old and Scr is >1.5 therefore will start apixaban 2.5mg  bid.   Melissa D Maccia 02/09/2015,5:34 PM

## 2015-02-09 NOTE — Consult Note (Signed)
Cardiology Consultation Note  Patient ID: Darryl Leonard., MRN: [id number], DOB/AGE: 14-May-1923 81 y.o. Admit date: 02/08/2015   Date of Consult: 02/09/2015 Primary Physician: [name], MD Primary Cardiologist: New to [facility]  Chief Complaint: Chest pain Reason for Consult: New onset Afib and chest pain  HPI: 82 y.o. male with h/o DM2, HTN, HLD, rheumatic fever, PVD, BPH, and OA who presented to [facility] ED on 9/28 with complaints of chest pain occuring at rest and found to develop new onset Afib with RVR while in the ED.   He has no previously known cardiac history and has never seen a cardiologist before. No prior echos, stress tests, or cardiac caths. Never had chest pain or tachy-palpitations before. Has been followed for PVD by his PCP. On pravastatin, no imaging available. Prior smoker in his younger years - none since the 67's.   After going to get the newspaper and sitting down to read it on 9/28 he began to develop upper chest pain that radiated to his neck and bilateral jaws. Some associated chills. No associated diaphoresis, SOB, nausea, vomiting, presyncope, or syncope. No associated palpitations. Pain was worse with deep inspiration. Pain lasted for 30 minutes. Pain resolved with SL NTG by EMS. He could hear his heart beat, but could not feel any associated palpitations. No recent illnesses, infections, or increased stressors. Weight has been stable. No increased LEE, orthopnea, or early satiety.  Upon his arrival to [facility] he was found to have negative troponin x 3. CXR with COPD, mild peripheral basilar reticular interstitial opacities, suggesting ILD. Advised chest CT. K+ 4.2, SCr 1.50-->1.66, hgb 11.6. In the ED he developed new onset Afib with RVR. Initial ECG showed NSR, heart rate of 95 bpm at 17:46. Follow up ECG at 20:54 showed Afib with RVR with heart rate of 134. He was treated with Cardizem gtt without a bolus. Echo is pending.   On telemetry he was in Afib with  RVR with heart rates in the 130's to 140's until 4:58 AM when he converted to sinus rhythm with frequent PACs with heart rates in the 70's to 90's.    Past Medical History  Diagnosis Date  . BENIGN PROSTATIC HYPERTROPHY 11/25/2007  . DIABETES MELLITUS, TYPE II 11/10/2006  . HYPERLIPIDEMIA 11/10/2006  . HYPERTENSION 12/13/2008  . OSTEOARTHRITIS 11/10/2006  . Rheumatic fever   . PVD (peripheral vascular disease)       Most Recent Cardiac Studies: Echo: pending   Surgical History:  Past Surgical History  Procedure Laterality Date  . Mastoid surgery    . Tendon repair      left foot  . Cataract extraction       Home Meds: Prior to Admission medications   Medication Sig Start Date End Date Taking? Authorizing Provider  aspirin EC 325 MG tablet Take 325 mg by mouth daily.   Yes Historical Provider, MD  benazepril-hydrochlorthiazide (LOTENSIN HCT) 10-12.5 MG tablet Take 1 tablet by mouth daily.   Yes Historical Provider, MD  ibuprofen (ADVIL,MOTRIN) 200 MG tablet Take 400 mg by mouth every 6 (six) hours as needed for mild pain.    Yes Historical Provider, MD  metFORMIN (GLUCOPHAGE) 1000 MG tablet Take 1,000 mg by mouth 2 (two) times daily with a meal.   Yes Historical Provider, MD  pravastatin (PRAVACHOL) 20 MG tablet Take 20 mg by mouth at bedtime.   Yes Historical Provider, MD  TRAVATAN Z 0.004 % SOLN ophthalmic solution Place 1 drop into both eyes at bedtime.  Yes Historical Provider, MD    Inpatient Medications:  . aspirin EC  325 mg Oral Daily  . enoxaparin (LOVENOX) injection  40 mg Subcutaneous QHS  . insulin aspart  0-9 Units Subcutaneous Q6H  . latanoprost  1 drop Both Eyes QHS  . pravastatin  20 mg Oral QHS  . sodium chloride  3 mL Intravenous Q12H   . sodium chloride 75 mL/hr at 02/09/15 0700  . diltiazem (CARDIZEM) infusion 5 mg/hr (02/09/15 0700)    Allergies: No Known Allergies  Social History   Social History  . Marital Status: Married    Spouse Name: N/A    . Number of Children: N/A  . Years of Education: N/A   Occupational History  . Not on file.   Social History Main Topics  . Smoking status: Never Smoker   . Smokeless tobacco: Never Used  . Alcohol Use: Yes  . Drug Use: No  . Sexual Activity: Not on file   Other Topics Concern  . Not on file   Social History Narrative     Family History  Problem Relation Age of Onset  . Heart disease Mother   . Cancer Father     lung ca     Review of Systems: Review of Systems  Constitutional: Positive for chills and malaise/fatigue. Negative for fever, weight loss and diaphoresis.  HENT: Negative for congestion.   Eyes: Negative for discharge and redness.  Respiratory: Negative for cough, hemoptysis, sputum production, shortness of breath and wheezing.   Cardiovascular: Positive for chest pain and leg swelling. Negative for palpitations, orthopnea, claudication and PND.  Gastrointestinal: Negative for nausea, vomiting, abdominal pain, diarrhea, constipation, blood in stool and melena.  Genitourinary: Negative for hematuria.  Musculoskeletal: Negative for myalgias and falls.  Skin: Negative for rash.  Neurological: Positive for weakness. Negative for dizziness, tingling, tremors, sensory change, speech change, focal weakness, seizures and loss of consciousness.  Endo/Heme/Allergies: Does not bruise/bleed easily.  Psychiatric/Behavioral: Negative for depression and substance abuse. The patient is not nervous/anxious.   All other systems reviewed and are negative.   Labs:  Recent Labs  02/08/15 1740 02/08/15 2120 02/09/15 0428  TROPONINI <0.03 <0.03 0.03   Lab Results  Component Value Date   WBC 9.7 02/09/2015   HGB 10.6* 02/09/2015   HCT 32.1* 02/09/2015   MCV 92.0 02/09/2015   PLT 213 02/09/2015     Recent Labs Lab 02/08/15 1740 02/09/15 0428  NA 136 137  K 4.2 4.3  CL 101 103  CO2 26 27  BUN 30* 31*  CREATININE 1.50* 1.66*  CALCIUM 8.7* 8.4*  PROT 7.0  --    BILITOT 0.5  --   ALKPHOS 71  --   ALT 6*  --   AST 20  --   GLUCOSE 157* 147*   Lab Results  Component Value Date   CHOL 173 02/16/2014   HDL 46.50 02/16/2014   LDLCALC 102* 02/16/2014   TRIG 125.0 02/16/2014   No results found for: DDIMER  Radiology/Studies:  Dg Chest 2 View  02/08/2015   CLINICAL DATA:  Chest pain.  EXAM: CHEST  2 VIEW  COMPARISON:  None.  FINDINGS: Normal heart size. Mild to moderate tortuosity of the atherosclerotic thoracic aorta. No pneumothorax. No pleural effusion. Lungs are mildly hyperinflated. No pulmonary edema. No focal lung consolidation to suggest a pneumonia. There are mild peripheral basilar reticular interstitial opacities in both lungs. Moderate degenerative changes are present in the thoracic spine.  IMPRESSION: 1.  Mildly hyperinflated lungs, suggesting COPD. 2. Mild peripheral basilar reticular interstitial opacities, suggesting interstitial lung disease. If clinically warranted, this can be further evaluated with a high-resolution protocol chest CT study.   Electronically Signed   By: Delbert Phenix M.D.   On: 02/08/2015 19:00   Ct Head Wo Contrast  02/08/2015   CLINICAL DATA:  Headache starting today.  EXAM: CT HEAD WITHOUT CONTRAST  TECHNIQUE: Contiguous axial images were obtained from the base of the skull through the vertex without intravenous contrast.  COMPARISON:  None.  FINDINGS: There is chronic diffuse atrophy. There is chronic bilateral periventricular white matter small vessel ischemic change. There is no midline shift, hydrocephalus, or mass. No acute hemorrhage or acute transcortical infarct is identified. The bony calvarium is intact. The visualized sinuses are clear.  IMPRESSION: No focal acute intracranial abnormality identified.  Chronic diffuse atrophy.   Electronically Signed   By: Sherian Rein M.D.   On: 02/08/2015 18:55    EKG: 9/28, 17:46 - NSR, 95 bpm. 9/28, 20:54 - Afib with RVR 134 bpm nonspecific st/t changes    Weights: Filed Weights   02/08/15 1743 02/09/15 0000  Weight: 200 lb (90.719 kg) 178 lb 5.6 oz (80.9 kg)     Physical Exam: Blood pressure 98/54, pulse 53, temperature 97.8 F (36.6 C), temperature source Oral, resp. rate 26, height 6' (1.829 m), weight 178 lb 5.6 oz (80.9 kg), SpO2 95 %. Body mass index is 24.18 kg/(m^2). General: Well developed, well nourished, in no acute distress. Head: Normocephalic, atraumatic, sclera non-icteric, no xanthomas, nares are without discharge.  Neck: Negative for carotid bruits. JVD not elevated. Lungs: Clear bilaterally to auscultation without wheezes, rales, or rhonchi. Breathing is unlabored. Heart: Irregular, with S1 S2. No murmurs, rubs, or gallops appreciated. Abdomen: Soft, non-tender, non-distended with normoactive bowel sounds. No hepatomegaly. No rebound/guarding. No obvious abdominal masses. Msk:  Strength and tone appear normal for age. Extremities: No clubbing or cyanosis. Trace pitting edema to the bilateral mid calves.  Distal pedal pulses are 2+ and equal bilaterally. Neuro: Alert and oriented X 3. No facial asymmetry. No focal deficit. Moves all extremities spontaneously. Psych:  Responds to questions appropriately with a normal affect.    Assessment and Plan:  81 y.o. male with h/o DM2, HTN, HLD, rheumatic fever, PVD, BPH, and OA who presented to [facility] ED on 9/28 with complaints of chest pain occuring at rest and found to develop new onset Afib with RVR while in the ED.   1. New onset Afib with RVR: -Currently in sinus rhythm with frequent PACs with heart rate in 70's -No obvious inciting event -Echo pending to evaluate OV function -Discontinue diltiazem gtt and transition to po diltiazem 30 mg q 6 hours, with plan to consolidate on 9/30 -Add Lopressor 25 mg q 6 hours with plan to consolidate to bid on 9/30 -CHADSVASc at least 5 (HTN, DM2, age x 2, vascular disease) -It is uncertain if he has been having prior episodes of Afib  as he does not feel tachy-palpitaitons -Recommend long term full dose anticoagulation with Xarelto or Eliquis  -He wants to think about this, Dr. Kirke Corin, MD to discuss further after he has thought about it throughout the day -Will need ischemic evaluation, he prefers to do this as an outpatient, offer him to do this inpatient on 9/30 though he declined  2. Atypical chest pain: -Occuring at rest and worse with deep inspiration -Troponin negative in the setting of the above Afib with RVR -  Will plan for outpatient nuc as above -Discontinue aspirin if patient agrees to anticoagulation as no role for dual therapy   3. HTN: -No longer hypotensive  -Meds as above  4. DM2: -Per IM     Signed, Eula Listen, PA-C Pager: [phone] 02/09/2015, 12:00 PM

## 2015-02-09 NOTE — Consult Note (Signed)
WOC wound consult note Reason for Consult: Chronic venous ulcer to left posterior lower leg.  Seen at wound care center 2 years ago.  Wound type:Chronic venous insufficiency Pressure Ulcer POA: N/A Measurement: Left first metarsal head: 0.5 cm x 0.5 cm x 0.1 cm Left posterior leg, just above Achilles tendon 4 cm x 6 cm x 0.2 cm Wound ZOX:WRUE are 100% fribin slough Drainage (amount, consistency, odor) Minimal seroanguinous drainage.  No odor.  Periwound: Intact.  No edema noted today.  Wife states he wears a removable compression garment for a few hours a few days/week when swollen.  Dressing procedure/placement/frequency:Cleanse ulcers to left foot with NS and pat gently dry.  Apply Santyl ointment to wound bed, 1/8 inch thickness (opaque).  Cover with NS moist gauze.  Secure with 4x4 gauze and kerlix. Change daily.  Please send this ointment home upon discharge.  Will not follow at this time.  Please re-consult if needed.  Maple Hudson RN BSN CWON Pager [phone]

## 2015-02-09 NOTE — Progress Notes (Signed)
*  PRELIMINARY RESULTS* Echocardiogram 2D Echocardiogram has been performed.  Garrel Ridgel Stills 02/09/2015, 10:01 PM

## 2015-02-09 NOTE — Progress Notes (Signed)
[facility] - [place] at [facility]   PATIENT NAME: Darryl Leonard    MR#:  [id number]  DATE OF BIRTH:  1924/03/19  SUBJECTIVE:  CHIEF COMPLAINT:   Chief Complaint  Patient presents with  . Chest Pain  . Hypertension  . Headache   patient here due to chest pain, and also noted to be in new onset atrial fibrillation. Now converted to normal sinus rhythm.  Chest pain-free family at bedside  REVIEW OF SYSTEMS:    Review of Systems  Constitutional: Negative for fever and chills.  HENT: Negative for congestion and tinnitus.   Eyes: Negative for blurred vision and double vision.  Respiratory: Negative for cough, shortness of breath and wheezing.   Cardiovascular: Negative for chest pain, orthopnea and PND.  Gastrointestinal: Negative for nausea, vomiting, abdominal pain and diarrhea.  Genitourinary: Negative for dysuria and hematuria.  Neurological: Negative for dizziness, sensory change and focal weakness.  All other systems reviewed and are negative.   Nutrition: Diabetic heart healthy Tolerating Diet: Yes Tolerating PT: Await Evaluation   DRUG ALLERGIES:  No Known Allergies  VITALS:  Blood pressure 105/54, pulse 72, temperature 97.8 F (36.6 C), temperature source Oral, resp. rate 29, height 6' (1.829 m), weight 80.9 kg (178 lb 5.6 oz), SpO2 96 %.  PHYSICAL EXAMINATION:   Physical Exam  GENERAL:  81 y.o.-year-old patient lying in the bed with no acute distress.  EYES: Pupils equal, round, reactive to light and accommodation. No scleral icterus. Extraocular muscles intact.  HEENT: Head atraumatic, normocephalic. Oropharynx and nasopharynx clear. Dry Oral Mucosa.    NECK:  Supple, no jugular venous distention. No thyroid enlargement, no tenderness.  LUNGS: Normal breath sounds bilaterally, no wheezing, rales, rhonchi. No use of accessory muscles of respiration.  CARDIOVASCULAR: S1, S2 normal. 2/6 diastolic murmur at the base, no rubs, or gallops.   ABDOMEN: Soft, nontender, nondistended. Bowel sounds present. No organomegaly or mass.  EXTREMITIES: No cyanosis, clubbing or edema b/l.    NEUROLOGIC: Cranial nerves II through XII are intact. No focal Motor or sensory deficits b/l.   PSYCHIATRIC: The patient is alert and oriented x 3. Good affect SKIN: No obvious rash, lesion, or ulcer.    LABORATORY PANEL:   CBC  Recent Labs Lab 02/09/15 0428  WBC 9.7  HGB 10.6*  HCT 32.1*  PLT 213   ------------------------------------------------------------------------------------------------------------------  Chemistries   Recent Labs Lab 02/08/15 1740 02/09/15 0428  NA 136 137  K 4.2 4.3  CL 101 103  CO2 26 27  GLUCOSE 157* 147*  BUN 30* 31*  CREATININE 1.50* 1.66*  CALCIUM 8.7* 8.4*  AST 20  --   ALT 6*  --   ALKPHOS 71  --   BILITOT 0.5  --    ------------------------------------------------------------------------------------------------------------------  Cardiac Enzymes  Recent Labs Lab 02/09/15 1240  TROPONINI 0.07*   ------------------------------------------------------------------------------------------------------------------  RADIOLOGY:  Dg Chest 2 View  02/08/2015   CLINICAL DATA:  Chest pain.  EXAM: CHEST  2 VIEW  COMPARISON:  None.  FINDINGS: Normal heart size. Mild to moderate tortuosity of the atherosclerotic thoracic aorta. No pneumothorax. No pleural effusion. Lungs are mildly hyperinflated. No pulmonary edema. No focal lung consolidation to suggest a pneumonia. There are mild peripheral basilar reticular interstitial opacities in both lungs. Moderate degenerative changes are present in the thoracic spine.  IMPRESSION: 1. Mildly hyperinflated lungs, suggesting COPD. 2. Mild peripheral basilar reticular interstitial opacities, suggesting interstitial lung disease. If clinically warranted, this can be further evaluated with  a high-resolution protocol chest CT study.   Electronically Signed   By: Delbert Phenix M.D.   On: 02/08/2015 19:00   Ct Head Wo Contrast  02/08/2015   CLINICAL DATA:  Headache starting today.  EXAM: CT HEAD WITHOUT CONTRAST  TECHNIQUE: Contiguous axial images were obtained from the base of the skull through the vertex without intravenous contrast.  COMPARISON:  None.  FINDINGS: There is chronic diffuse atrophy. There is chronic bilateral periventricular white matter small vessel ischemic change. There is no midline shift, hydrocephalus, or mass. No acute hemorrhage or acute transcortical infarct is identified. The bony calvarium is intact. The visualized sinuses are clear.  IMPRESSION: No focal acute intracranial abnormality identified.  Chronic diffuse atrophy.   Electronically Signed   By: Sherian Rein M.D.   On: 02/08/2015 18:55     ASSESSMENT AND PLAN:   81 year old male with past nuchal history of diabetes, hypertension, hyperlipidemia, osteoporosis, history of rheumatic fever, history of peripheral vascular disease, who presented to the hospital due to chest pain and also noted to be in new onset afibrillation.  #1 atrial fibrillation with RVR-patient's heart rate has improved. Currently being weaned off the Cardizem drip. -Continue oral Cardizem. Appreciate cardiology input. -Patient's chads score is 5 and likely would benefit from long-term anticoagulation with Eliquis on Xarelto. -Await two-dimensional echocardiogram.  #2 chest pain-atypical and likely related to underlying A. Fib. -No evidence of acute coronary syndrome. Await two-dimensional echocardiogram. -Continue aspirin, Cardizem, metoprolol, Pravachol  #3 type 2 diabetes without consultation-continue NovoLog sliding scale.  #4 glaucoma-continue latanoprost eyedrops.  #5. Lipidemia-continue Pravachol.  All the records are reviewed and case discussed with Care Management/Social Workerr. Management plans discussed with the patient, family and they are in agreement.  CODE STATUS: Full  DVT Prophylaxis:  Lovenox  TOTAL TIME TAKING CARE OF THIS PATIENT: 30 minutes.   POSSIBLE D/C IN 1-2 DAYS, DEPENDING ON CLINICAL CONDITION.   Houston Siren M.D on 02/09/2015 at 3:56 PM  Between 7am to 6pm - Pager - [phone]  After 6pm go to [email or web address] - password [email or web address] [facility]  [place]  Office  [phone]  CC: Primary care physician; [name], MD

## 2015-02-10 LAB — BASIC METABOLIC PANEL
ANION GAP: 8 (ref 5–15)
BUN: 29 mg/dL — ABNORMAL HIGH (ref 6–20)
CHLORIDE: 107 mmol/L (ref 101–111)
CO2: 25 mmol/L (ref 22–32)
CREATININE: 1.34 mg/dL — AB (ref 0.61–1.24)
Calcium: 8.7 mg/dL — ABNORMAL LOW (ref 8.9–10.3)
GFR calc non Af Amer: 45 mL/min — ABNORMAL LOW (ref >60)
GFR, EST AFRICAN AMERICAN: 52 mL/min — AB (ref >60)
Glucose, Bld: 131 mg/dL — ABNORMAL HIGH (ref 65–99)
POTASSIUM: 3.7 mmol/L (ref 3.5–5.1)
SODIUM: 140 mmol/L (ref 135–145)

## 2015-02-10 LAB — GLUCOSE, CAPILLARY
GLUCOSE-CAPILLARY: 124 mg/dL — AB (ref 65–99)
GLUCOSE-CAPILLARY: 129 mg/dL — AB (ref 65–99)

## 2015-02-10 MED ORDER — APIXABAN 5 MG PO TABS: 5.0000 mg | ORAL_TABLET | Freq: Two times a day (BID) | ORAL | Status: DC

## 2015-02-10 MED ORDER — METOPROLOL TARTRATE 50 MG PO TABS: 50.0000 mg | ORAL_TABLET | Freq: Two times a day (BID) | ORAL | Status: DC

## 2015-02-10 MED ORDER — APIXABAN 5 MG PO TABS
5.0000 mg | ORAL_TABLET | Freq: Two times a day (BID) | ORAL | Status: DC
Start: 2015-02-10 — End: 2015-02-10
  Administered 2015-02-10: 5 mg via ORAL
  Filled 2015-02-10: qty 1

## 2015-02-10 MED ORDER — BENAZEPRIL HCL 10 MG PO TABS: 10.0000 mg | ORAL_TABLET | Freq: Every day | ORAL | Status: DC

## 2015-02-10 NOTE — Consult Note (Signed)
ANTICOAGULATION CONSULT NOTE - Initial Consult  Pharmacy Consult for apixaban Indication: atrial fibrillation  No Known Allergies  Patient Measurements: Height: 6' (182.9 cm) Weight: 178 lb 5.6 oz (80.9 kg) IBW/kg (Calculated) : 77.6 Heparin Dosing Weight:   Vital Signs: BP: 117/79 mmHg (09/30 1258) Pulse Rate: 77 (09/30 1259)  Labs:  Recent Labs  02/08/15 1740 02/08/15 2120 02/09/15 0428 02/09/15 1240 02/10/15 0519  HGB 11.6*  --  10.6*  --   --   HCT 34.7*  --  32.1*  --   --   PLT 248  --  213  --   --   APTT 32  --   --   --   --   LABPROT 13.0  --   --   --   --   INR 0.96  --   --   --   --   CREATININE 1.50*  --  1.66*  --  1.34*  TROPONINI <0.03 <0.03 0.03 0.07*  --     Estimated Creatinine Clearance: 41 mL/min (by C-G formula based on Cr of 1.34).   Medical History: Past Medical History  Diagnosis Date  . BENIGN PROSTATIC HYPERTROPHY 11/25/2007  . DIABETES MELLITUS, TYPE II 11/10/2006  . HYPERLIPIDEMIA 11/10/2006  . HYPERTENSION 12/13/2008  . OSTEOARTHRITIS 11/10/2006  . Rheumatic fever   . PVD (peripheral vascular disease)     Medications:  Scheduled:  . apixaban  5 mg Oral BID  . aspirin EC  325 mg Oral Daily  . collagenase   Topical Daily  . diltiazem  30 mg Oral 4 times per day  . insulin aspart  0-9 Units Subcutaneous Q6H  . latanoprost  1 drop Both Eyes QHS  . metoprolol tartrate  25 mg Oral BID  . pravastatin  20 mg Oral QHS  . sodium chloride  3 mL Intravenous Q12H    Assessment: Pt is a 81 year old male with new onset afib. Chads score of 5. Pharmacy consulted to dose apixaban.  Goal of Therapy:   Monitor platelets by anticoagulation protocol: Yes   Plan:  Will increase apixaban to 5 mg bid as SCr improved to 1.34 today.   Luisa Hart D 02/10/2015,1:18 PM

## 2015-02-10 NOTE — Progress Notes (Signed)
PT Cancellation Note  Patient Details Name: Darryl Leonard. MRN: [id number] DOB: Dec 06, 1923   Cancelled Treatment:    Reason Eval/Treat Not Completed: PT screened, no needs identified, will sign off. PT entered patients room to find him comfortably sitting in chair. Per family and RN present, patient is feeling much better and ambulated 1 lap with no issues prior to PT. Patient and family do not think he needs Home Health and are comfortable going home without PT evaluation. PT will sign off.   Kerin Ransom, PT, DPT    02/10/2015, 1:59 PM

## 2015-02-10 NOTE — Care Management Note (Signed)
Case Management Note  Patient Details  Name: Darryl Leonard. MRN: [id number] Date of Birth: 1923/12/12  Subjective/Objective:   New onset afib. Case discussed with Dr. Tor Netters. He states family and patient has declined home health. Met with patient and his wife. Gave 30 free trial card for Eliquis. Verfied again that they didn't want home health. They both feel they have good support with the daughter in the home. Denies issues obtaniing medications, co pays or medical care.                  Action/Plan: Home with self care.   Expected Discharge Date:                  Expected Discharge Plan:  Home/Self Care  In-House Referral:     Discharge planning Services     Post Acute Care Choice:    Choice offered to:     DME Arranged:    DME Agency:     HH Arranged:    [place] Agency:     Status of Service:  Completed, signed off  Medicare Important Message Given:    Date Medicare IM Given:    Medicare IM give by:    Date Additional Medicare IM Given:    Additional Medicare Important Message give by:     If discussed at [place] of Stay Meetings, dates discussed:    Additional Comments:  Jolly Mango, RN 02/10/2015, 11:13 AM

## 2015-02-10 NOTE — Progress Notes (Signed)
SUBJECTIVE:  He feels better. maintaining in NSR with PACs.    Filed Vitals:   02/10/15 0600 02/10/15 0628 02/10/15 0700 02/10/15 0800  BP: 107/66 107/66 122/77 107/64  Pulse: 87  98 67  Temp:      TempSrc:      Resp: Height:      Weight:      SpO2: 94%  97% 96%    Intake/Output Summary (Last 24 hours) at 02/10/15 1105 Last data filed at 02/10/15 0900  Gross per 24 hour  Intake    480 ml  Output   2150 ml  Net  -1670 ml    LABS: Basic Metabolic Panel:  Recent Labs  40/98/11 0428 02/10/15 0519  NA 137 140  K 4.3 3.7  CL 103 107  CO2 27 25  GLUCOSE 147* 131*  BUN 31* 29*  CREATININE 1.66* 1.34*  CALCIUM 8.4* 8.7*   Liver Function Tests:  Recent Labs  02/08/15 1740  AST 20  ALT 6*  ALKPHOS 71  BILITOT 0.5  PROT 7.0  ALBUMIN 4.1   No results for input(s): LIPASE, AMYLASE in the last 72 hours. CBC:  Recent Labs  02/08/15 1740 02/09/15 0428  WBC 10.1 9.7  NEUTROABS 8.3*  --   HGB 11.6* 10.6*  HCT 34.7* 32.1*  MCV 92.0 92.0  PLT 248 213   Cardiac Enzymes:  Recent Labs  02/08/15 2120 02/09/15 0428 02/09/15 1240  TROPONINI <0.03 0.03 0.07*   BNP: Invalid input(s): POCBNP D-Dimer: No results for input(s): DDIMER in the last 72 hours. Hemoglobin A1C:  Recent Labs  02/08/15 1740  HGBA1C 6.0   Fasting Lipid Panel: No results for input(s): CHOL, HDL, LDLCALC, TRIG, CHOLHDL, LDLDIRECT in the last 72 hours. Thyroid Function Tests: No results for input(s): TSH, T4TOTAL, T3FREE, THYROIDAB in the last 72 hours.  Invalid input(s): FREET3 Anemia Panel: No results for input(s): VITAMINB12, FOLATE, FERRITIN, TIBC, IRON, RETICCTPCT in the last 72 hours.   PHYSICAL EXAM General: Well developed, well nourished, in no acute distress HEENT:  Normocephalic and atramatic Neck:  No JVD.  Lungs: Clear bilaterally to auscultation and percussion. Heart: HRRR . Normal S1 and S2 without gallops or murmurs.  Abdomen: Bowel sounds are  positive, abdomen soft and non-tender  Msk:  Back normal, normal gait. Normal strength and tone for age. Extremities: No clubbing, cyanosis or edema.   Neuro: Alert and oriented X 3. Psych:  Good affect, responds appropriately  TELEMETRY: Reviewed telemetry pt in : NSR with PACs.    ASSESSMENT AND PLAN:   81 y.o. male with h/o DM2, HTN, HLD, rheumatic fever, PVD, BPH, and OA who presented to [facility] ED on 9/28 with complaints of chest pain occuring at rest and found to develop new onset Afib with RVR while in the ED.   1. New onset Afib with RVR: -Currently in sinus rhythm with frequent PACs with heart rate in 70's -Echo showed normal LVSF with no significant valvular abnormalities.  -CHADSVASc at least 5 (HTN, DM2, age x 2, vascular disease) - discussed anticoagulation and decided to start Eliquis 5 mg bid. Creatinine is borderline but still below 1.5.  - Metoprolol 50 mg bid.  OK to discharge from a cardiac standpoint.   2. Atypical chest pain: -Occuring at rest and worse with deep inspiration -Troponin negative in the setting of the above Afib with RVR - if recurrent , will do outpatient stress test.   3. HTN: -No longer  hypotensive  -Meds as above    Lorine Bears, MD, [facility] 02/10/2015 11:05 AM

## 2015-02-10 NOTE — Care Management Important Message (Signed)
Important Message  Patient Details  Name: Darryl Leonard. MRN: [id number] Date of Birth: 01-03-1924   Medicare Important Message Given:  Yes-second notification given    Verita Schneiders Allmond 02/10/2015, 1:31 PM

## 2015-02-10 NOTE — Clinical Documentation Improvement (Signed)
  Hospitalist  Can the diagnosis of acute renal failure be further specified?   Acute Renal Failure/Acute Kidney Injury  Acute Tubular Necrosis  Acute Renal Cortical Necrosis  Acute Renal Medullary Necrosis  Acute on Chronic Renal Failure  Chronic Renal Failure  Other  Clinically Undetermined  Document any associated diagnoses/conditions.   Supporting Information: BUN 30, 31, 29; Creatinine - 1.5, 1.66, 1.34; GFR 46, 35, 45   Please exercise your independent, professional judgment when responding. A specific answer is not anticipated or expected.   Thank You,  Harrie Jeans, RN, BSN, MBA, [facility] Health Information Management [place] [phone]

## 2015-02-10 NOTE — Discharge Instructions (Signed)

## 2015-02-10 NOTE — Discharge Summary (Signed)
[facility] [place] at [facility]   PATIENT NAME: Darryl Leonard    MR#:  [id number]  DATE OF BIRTH:  Jan 11, 1924  DATE OF ADMISSION:  02/08/2015 ADMITTING PHYSICIAN: Oralia Manis, MD  DATE OF DISCHARGE: 02/10/2015  2:15 PM  PRIMARY CARE PHYSICIAN: [name], MD    ADMISSION DIAGNOSIS:  Chest pain, unspecified chest pain type [R07.9]  DISCHARGE DIAGNOSIS:  Principal Problem:   Unstable angina Active Problems:   Diabetes   Essential hypertension   HLD (hyperlipidemia)   Atrial fibrillation with RVR   SECONDARY DIAGNOSIS:   Past Medical History  Diagnosis Date  . BENIGN PROSTATIC HYPERTROPHY 11/25/2007  . DIABETES MELLITUS, TYPE II 11/10/2006  . HYPERLIPIDEMIA 11/10/2006  . HYPERTENSION 12/13/2008  . OSTEOARTHRITIS 11/10/2006  . Rheumatic fever   . PVD (peripheral vascular disease)     HOSPITAL COURSE:   81 year old male with past nuchal history of diabetes, hypertension, hyperlipidemia, osteoporosis, history of rheumatic fever, history of peripheral vascular disease, who presented to the hospital due to chest pain and also noted to be in new onset afibrillation.  #1 atrial fibrillation with RVR-patient presented to the hospital with palpitations and elevated heart rate and noted to be in new onset fibrillation. -Patient was admitted to the intensive care unit and started on a Cardizem drip. Cardiology consult was obtained. Patient was eventually weaned off the Cardizem drip and started on oral metoprolol and oral Cardizem. He has clinically improved and is asymptomatic now and has converted to normal sinus rhythm. -He is presently being discharged on oral metoprolol, and also oral long-term medical evaluation with Eliquis and follow up with [facility] cardiology as an outpatient.  #2 chest pain-atypical and was likely related to underlying A. Fib. -No evidence of acute coronary syndrome. Patient had an echocardiogram which showed normal  ejection fraction and no evidence of any acute wall motion abnormalities. -Patient will Continue aspirin, metoprolol, Pravachol, benazepril and he will follow-up with cardiology as outpatient  #3 type 2 diabetes without consultation-patient will continue his metformin but while in the hospital he was maintained on sliding scale insulin.  #4 glaucoma-patient will continue latanoprost eyedrops.  #5. Lipidemia-patient will continue Pravachol.   DISCHARGE CONDITIONS:   Stable  CONSULTS OBTAINED:  Treatment Team:  Iran Ouch, MD  DRUG ALLERGIES:  No Known Allergies  DISCHARGE MEDICATIONS:   Discharge Medication List as of 02/10/2015  2:01 PM    START taking these medications   Details  apixaban (ELIQUIS) 5 MG TABS tablet Take 1 tablet (5 mg total) by mouth 2 (two) times daily., Starting 02/10/2015, Until Discontinued, Print    benazepril (LOTENSIN) 10 MG tablet Take 1 tablet (10 mg total) by mouth daily., Starting 02/10/2015, Until Discontinued, Print    metoprolol tartrate (LOPRESSOR) 50 MG tablet Take 1 tablet (50 mg total) by mouth 2 (two) times daily., Starting 02/10/2015, Until Discontinued, Print      CONTINUE these medications which have NOT CHANGED   Details  metFORMIN (GLUCOPHAGE) 1000 MG tablet Take 1,000 mg by mouth 2 (two) times daily with a meal., Until Discontinued, Historical Med    pravastatin (PRAVACHOL) 20 MG tablet Take 20 mg by mouth at bedtime., Until Discontinued, Historical Med    TRAVATAN Z 0.004 % SOLN ophthalmic solution Place 1 drop into both eyes at bedtime. , Until Discontinued, Historical Med      STOP taking these medications     aspirin EC 325 MG tablet      benazepril-hydrochlorthiazide (LOTENSIN  HCT) 10-12.5 MG tablet      ibuprofen (ADVIL,MOTRIN) 200 MG tablet          DISCHARGE INSTRUCTIONS:   DIET:  Cardiac diet and Diabetic diet  DISCHARGE CONDITION:  Stable  ACTIVITY:  Activity as tolerated  OXYGEN:  Home Oxygen:  No.   Oxygen Delivery: room air  DISCHARGE LOCATION:  home   If you experience worsening of your admission symptoms, develop shortness of breath, life threatening emergency, suicidal or homicidal thoughts you must seek medical attention immediately by calling 911 or calling your MD immediately  if symptoms less severe.  You Must read complete instructions/literature along with all the possible adverse reactions/side effects for all the Medicines you take and that have been prescribed to you. Take any new Medicines after you have completely understood and accpet all the possible adverse reactions/side effects.   Please note  You were cared for by a hospitalist during your hospital stay. If you have any questions about your discharge medications or the care you received while you were in the hospital after you are discharged, you can call the unit and asked to speak with the hospitalist on call if the hospitalist that took care of you is not available. Once you are discharged, your primary care physician will handle any further medical issues. Please note that NO REFILLS for any discharge medications will be authorized once you are discharged, as it is imperative that you return to your primary care physician (or establish a relationship with a primary care physician if you do not have one) for your aftercare needs so that they can reassess your need for medications and monitor your lab values.     Today   Patient sitting up in bed in no apparent distress. No chest pain, palpitations, nausea, vomiting. Converted to normal sinus rhythm with some PACs.  VITAL SIGNS:  Blood pressure 130/72, pulse 77, temperature 98.3 F (36.8 C), temperature source Oral, resp. rate 29, height 6' (1.829 m), weight 80.9 kg (178 lb 5.6 oz), SpO2 97 %.  I/O:   Intake/Output Summary (Last 24 hours) at 02/10/15 1613 Last data filed at 02/10/15 0900  Gross per 24 hour  Intake    240 ml  Output   1950 ml  Net   -1710 ml    PHYSICAL EXAMINATION:   GENERAL: 81 y.o.-year-old patient lying in the bed with no acute distress.  EYES: Pupils equal, round, reactive to light and accommodation. No scleral icterus. Extraocular muscles intact.  HEENT: Head atraumatic, normocephalic. Oropharynx and nasopharynx clear. Dry Oral Mucosa.  NECK: Supple, no jugular venous distention. No thyroid enlargement, no tenderness.  LUNGS: Normal breath sounds bilaterally, no wheezing, rales, rhonchi. No use of accessory muscles of respiration.  CARDIOVASCULAR: S1, S2 normal. 2/6 diastolic murmur at the base, no rubs, or gallops.  ABDOMEN: Soft, nontender, nondistended. Bowel sounds present. No organomegaly or mass.  EXTREMITIES: No cyanosis, clubbing or edema b/l.  NEUROLOGIC: Cranial nerves II through XII are intact. No focal Motor or sensory deficits b/l.  PSYCHIATRIC: The patient is alert and oriented x 3. Good affect SKIN: No obvious rash, lesion, or ulcer.   DATA REVIEW:   CBC  Recent Labs Lab 02/09/15 0428  WBC 9.7  HGB 10.6*  HCT 32.1*  PLT 213    Chemistries   Recent Labs Lab 02/08/15 1740  02/10/15 0519  NA 136  < > 140  K 4.2  < > 3.7  CL 101  < > 107  CO2 26  < > 25  GLUCOSE 157*  < > 131*  BUN 30*  < > 29*  CREATININE 1.50*  < > 1.34*  CALCIUM 8.7*  < > 8.7*  AST 20  --   --   ALT 6*  --   --   ALKPHOS 71  --   --   BILITOT 0.5  --   --   < > = values in this interval not displayed.  Cardiac Enzymes  Recent Labs Lab 02/09/15 1240  TROPONINI 0.07*    Microbiology Results  Results for orders placed or performed during the hospital encounter of 02/08/15  MRSA PCR Screening     Status: None   Collection Time: 02/09/15 12:23 AM  Result Value Ref Range Status   MRSA by PCR NEGATIVE NEGATIVE Final    Comment:        The GeneXpert MRSA Assay (FDA approved for NASAL specimens only), is one component of a comprehensive MRSA colonization surveillance program. It is  not intended to diagnose MRSA infection nor to guide or monitor treatment for MRSA infections.     RADIOLOGY:  Dg Chest 2 View  02/08/2015   CLINICAL DATA:  Chest pain.  EXAM: CHEST  2 VIEW  COMPARISON:  None.  FINDINGS: Normal heart size. Mild to moderate tortuosity of the atherosclerotic thoracic aorta. No pneumothorax. No pleural effusion. Lungs are mildly hyperinflated. No pulmonary edema. No focal lung consolidation to suggest a pneumonia. There are mild peripheral basilar reticular interstitial opacities in both lungs. Moderate degenerative changes are present in the thoracic spine.  IMPRESSION: 1. Mildly hyperinflated lungs, suggesting COPD. 2. Mild peripheral basilar reticular interstitial opacities, suggesting interstitial lung disease. If clinically warranted, this can be further evaluated with a high-resolution protocol chest CT study.   Electronically Signed   By: Delbert Phenix M.D.   On: 02/08/2015 19:00   Ct Head Wo Contrast  02/08/2015   CLINICAL DATA:  Headache starting today.  EXAM: CT HEAD WITHOUT CONTRAST  TECHNIQUE: Contiguous axial images were obtained from the base of the skull through the vertex without intravenous contrast.  COMPARISON:  None.  FINDINGS: There is chronic diffuse atrophy. There is chronic bilateral periventricular white matter small vessel ischemic change. There is no midline shift, hydrocephalus, or mass. No acute hemorrhage or acute transcortical infarct is identified. The bony calvarium is intact. The visualized sinuses are clear.  IMPRESSION: No focal acute intracranial abnormality identified.  Chronic diffuse atrophy.   Electronically Signed   By: Sherian Rein M.D.   On: 02/08/2015 18:55      Management plans discussed with the patient, family and they are in agreement.  CODE STATUS:     Code Status Orders        Start     Ordered   02/09/15 0014  Full code   Continuous     02/09/15 0013    Advance Directive Documentation        Most Recent  Value   Type of Advance Directive  Healthcare Power of Attorney   Pre-existing out of facility DNR order (yellow form or pink MOST form)     "MOST" Form in Place?        TOTAL TIME TAKING CARE OF THIS PATIENT: 40 minutes.    Houston Siren M.D on 02/10/2015 at 4:13 PM  Between 7am to 6pm - Pager - [phone]  After 6pm go to [email or web address] - password [facility]  Office  [phone]  CC: Primary care physician; [name], MD

## 2015-02-10 NOTE — Progress Notes (Signed)
   02/10/15 1100  Clinical Encounter Type  Visited With Patient and family together  Visit Type Initial;Spiritual support  Consult/Referral To Chaplain  Spiritual Encounters  Spiritual Needs Emotional  Stress Factors  Patient Stress Factors None identified  Family Stress Factors None identified  Chaplain rounded in the unit and offered a compassionate presence and support to the patient and family. Patient and family appeared optimistic as there was discussion about his discharge. Asked to be kept in prayer and I offered a blessing. Patient received it and was pleasant. Chaplain Sonya A. Laws Ext. [phone]

## 2015-02-13 ENCOUNTER — Telehealth: Payer: Self-pay | Admitting: Internal Medicine

## 2015-02-13 MED ORDER — METFORMIN HCL 1000 MG PO TABS: 1000.0000 mg | ORAL_TABLET | Freq: Two times a day (BID) | ORAL | Status: DC

## 2015-02-13 MED ORDER — PRAVASTATIN SODIUM 20 MG PO TABS: 20.0000 mg | ORAL_TABLET | Freq: Every day | ORAL | Status: DC

## 2015-02-13 NOTE — Telephone Encounter (Signed)
Please call pt and make sure schedule for Physical this month or next. Rx's sent to pharmacy [facility].

## 2015-02-13 NOTE — Telephone Encounter (Signed)
lmovm to call and schedule a physical  °

## 2015-02-13 NOTE — Telephone Encounter (Signed)
Pt request refill of the following: metFORMIN (GLUCOPHAGE) 1000 MG tablet, pravastatin (PRAVACHOL) 20 MG tablet   Phamacy :  [facility]

## 2015-02-20 ENCOUNTER — Telehealth: Payer: Self-pay | Admitting: *Deleted

## 2015-02-20 ENCOUNTER — Other Ambulatory Visit: Payer: Self-pay | Admitting: *Deleted

## 2015-02-20 MED ORDER — APIXABAN 5 MG PO TABS
5.0000 mg | ORAL_TABLET | Freq: Two times a day (BID) | ORAL | Status: DC
Start: 2015-02-20 — End: 2015-06-26

## 2015-02-20 NOTE — Telephone Encounter (Signed)
Spoke with pt and she mentioned that pt has appointment in November with Eula Listen. She would not have enough pills until pt is seen. She was notified to contact pharmacy for medication had 1 refill to have filled sent to local pharmacy for refill. She is aware that I offered $10 co-pay card depending on insurance she may be able to use to help with cost. She will contact office if she has any further problems.

## 2015-02-20 NOTE — Telephone Encounter (Signed)
Pt wife calling stating when pt was in hospital he was given a coupon for Eliquis and they need a refill.  1. Which medications need to be refilled? Eliquis   2. Which pharmacy is medication to be sent to?[facility] pharmacy mail order  3. Do they need a 30 day or 90 day supply? 90 day   4. Would they like a call back once the medication has been sent to the pharmacy? No   Pt wife also states that it will be $150 and they have already used the coupon once. Not sure if they can get another one. Or if there is something we can do to help.  Please do advise.

## 2015-02-22 NOTE — Telephone Encounter (Signed)
lmovm to call and schedule physical

## 2015-02-23 NOTE — Telephone Encounter (Signed)
Pt will be transfering to the [place] office in March 2017. Pt said he just got out of the hospital and feels he can wait to see his new doctor to have a physical.

## 2015-02-27 NOTE — Telephone Encounter (Signed)
FYI

## 2015-03-06 ENCOUNTER — Encounter: Payer: Self-pay | Admitting: Internal Medicine

## 2015-03-06 ENCOUNTER — Ambulatory Visit (INDEPENDENT_AMBULATORY_CARE_PROVIDER_SITE_OTHER): Payer: Medicare PPO | Admitting: Internal Medicine

## 2015-03-06 VITALS — BP 140/70 | HR 66 | Temp 98.6°F | Resp 16 | Ht 68.75 in | Wt 183.0 lb

## 2015-03-06 DIAGNOSIS — R4189 Other symptoms and signs involving cognitive functions and awareness: Secondary | ICD-10-CM

## 2015-03-06 DIAGNOSIS — E7800 Pure hypercholesterolemia, unspecified: Secondary | ICD-10-CM | POA: Diagnosis not present

## 2015-03-06 DIAGNOSIS — I4891 Unspecified atrial fibrillation: Secondary | ICD-10-CM | POA: Diagnosis not present

## 2015-03-06 DIAGNOSIS — I10 Essential (primary) hypertension: Secondary | ICD-10-CM | POA: Diagnosis not present

## 2015-03-06 MED ORDER — METFORMIN HCL 1000 MG PO TABS: 1000.0000 mg | ORAL_TABLET | Freq: Two times a day (BID) | ORAL | Status: DC

## 2015-03-06 MED ORDER — TRAMADOL HCL 50 MG PO TABS: 50.0000 mg | ORAL_TABLET | Freq: Four times a day (QID) | ORAL | Status: DC | PRN

## 2015-03-06 MED ORDER — DONEPEZIL HCL 5 MG PO TABS: 5.0000 mg | ORAL_TABLET | Freq: Every day | ORAL | Status: DC

## 2015-03-06 NOTE — Progress Notes (Signed)
Subjective:    Patient ID: Darryl Leonard., male    DOB: 04/06/1924, 81 y.o.   MRN: [id number]  HPI  81 year old patient who is seen today in follow-up following a recent hospital admission   DATE OF ADMISSION: 9/28/2016ADMITTING PHYSICIAN: Oralia Manis, MD  DATE OF DISCHARGE: 02/10/2015 2:15 PM  PRIMARY CARE PHYSICIAN: [name], MD    ADMISSION DIAGNOSIS:  Chest pain, unspecified chest pain type [R07.9]  DISCHARGE DIAGNOSIS:  Principal Problem:  Unstable angina Active Problems:  Diabetes  Essential hypertension  HLD (hyperlipidemia)  Atrial fibrillation with RVR     Since his discharge.  She has done quite well.  He is accompanied by his wife and daughter who have multiple concerns. He now is on chronic anticoagulation with Eliquis and there are some cost concerns.  He is scheduled for cardiology follow-up soon.  Denies any palpitations, recurrent chest pain or shortness of breath.  No bleeding issues  Due to the chronic anticoagulation, anti-inflammatory medications have been discontinued.  The patient has had considerably more arthritic pain, which has affected his mobility and quality of life.  His wife and daughter also complaining of short-term memory loss over the past 2 years that has intensified since his hospital admission  MMSE performed today with a score of 23 out of 30  Past Medical History  Diagnosis Date  . BENIGN PROSTATIC HYPERTROPHY 11/25/2007  . DIABETES MELLITUS, TYPE II 11/10/2006  . HYPERLIPIDEMIA 11/10/2006  . HYPERTENSION 12/13/2008  . OSTEOARTHRITIS 11/10/2006  . Rheumatic fever   . PVD (peripheral vascular disease) (HCC)     Social History   Social History  . Marital Status: Married    Spouse Name: N/A  . Number of Children: N/A  . Years of Education: N/A   Occupational History  . Not on file.   Social History Main Topics  . Smoking status: Never Smoker   . Smokeless tobacco: Never Used  . Alcohol  Use: Yes  . Drug Use: No  . Sexual Activity: Not on file   Other Topics Concern  . Not on file   Social History Narrative    Past Surgical History  Procedure Laterality Date  . Mastoid surgery    . Tendon repair      left foot  . Cataract extraction      Family History  Problem Relation Age of Onset  . Heart disease Mother   . Cancer Father     lung ca    No Known Allergies  Current Outpatient Prescriptions on File Prior to Visit  Medication Sig Dispense Refill  . apixaban (ELIQUIS) 5 MG TABS tablet Take 1 tablet (5 mg total) by mouth 2 (two) times daily. 60 tablet 1  . benazepril (LOTENSIN) 10 MG tablet Take 1 tablet (10 mg total) by mouth daily. 60 tablet 1  . metoprolol tartrate (LOPRESSOR) 50 MG tablet Take 1 tablet (50 mg total) by mouth 2 (two) times daily. 60 tablet 1  . pravastatin (PRAVACHOL) 20 MG tablet Take 1 tablet (20 mg total) by mouth at bedtime. 90 tablet 0  . TRAVATAN Z 0.004 % SOLN ophthalmic solution Place 1 drop into both eyes at bedtime.      No current facility-administered medications on file prior to visit.    BP 140/70 mmHg  Pulse 66  Temp(Src) 98.6 F (37 C) (Oral)  Resp 16  Ht 5' 8.75" (1.746 m)  Wt 183 lb (83.008 kg)  BMI 27.23 kg/m2  SpO2 93%  Review of Systems  Constitutional: Negative for fever, chills, appetite change and fatigue.  HENT: Negative for congestion, dental problem, ear pain, hearing loss, sore throat, tinnitus, trouble swallowing and voice change.   Eyes: Negative for pain, discharge and visual disturbance.  Respiratory: Negative for cough, chest tightness, wheezing and stridor.   Cardiovascular: Negative for chest pain, palpitations and leg swelling.  Gastrointestinal: Negative for nausea, vomiting, abdominal pain, diarrhea, constipation, blood in stool and abdominal distention.  Genitourinary: Negative for urgency, hematuria, flank pain, discharge, difficulty urinating and genital sores.  Musculoskeletal:  Positive for back pain, arthralgias, gait problem, neck pain and neck stiffness. Negative for myalgias and joint swelling.  Skin: Negative for rash.  Neurological: Negative for dizziness, syncope, speech difficulty, weakness, numbness and headaches.  Hematological: Negative for adenopathy. Does not bruise/bleed easily.  Psychiatric/Behavioral: Positive for confusion and decreased concentration. Negative for behavioral problems and dysphoric mood. The patient is not nervous/anxious.        Objective:   Physical Exam  Constitutional: He is oriented to person, place, and time. He appears well-developed.  HENT:  Head: Normocephalic.  Right Ear: External ear normal.  Left Ear: External ear normal.  Eyes: Conjunctivae and EOM are normal.  Neck: Normal range of motion.  Cardiovascular: Normal rate, regular rhythm and normal heart sounds.   Pulmonary/Chest: Breath sounds normal.  Abdominal: Bowel sounds are normal.  Musculoskeletal: Normal range of motion. He exhibits edema. He exhibits no tenderness.  Neurological: He is alert and oriented to person, place, and time.  MMSE 23/30  Psychiatric: He has a normal mood and affect. His behavior is normal.          Assessment & Plan:   Paroxysmal atrial fibrillation with rapid ventricular response.  Anticoagulation options discussed.  Will follow-up with cardiology soon.  Samples of Eliquis provided.  Remains in normal sinus rhythm at this time  Osteoarthritis.  Patient has had increasing pain, which is affecting his mobility and quality of life.  Will give a trial of Tylenol, but this is ineffective, will try tramadol when necessary  Cognitive impairment.  MMSE 23/30.  Options discussed.  Will start Aricept 5 mg daily.  Recheck 1 month.  May consider increasing dose at that time    Diabetes mellitus.  Continue metformin therapy  Hypertension, well-controlled  Return in 4-6  weeks for follow-up (patient may establish with a new provider in  the [place] area)

## 2015-03-06 NOTE — Progress Notes (Signed)
Pre visit review using our clinic review tool, if applicable. No additional management support is needed unless otherwise documented below in the visit note. 

## 2015-03-06 NOTE — Patient Instructions (Signed)
Limit your sodium (Salt) intake  Cardiology follow-up as scheduled  Return in one month for follow-up or [facility]

## 2015-03-16 ENCOUNTER — Encounter: Payer: Medicare PPO | Admitting: Physician Assistant

## 2015-03-16 ENCOUNTER — Encounter: Payer: Self-pay | Admitting: *Deleted

## 2015-03-22 ENCOUNTER — Ambulatory Visit (INDEPENDENT_AMBULATORY_CARE_PROVIDER_SITE_OTHER): Payer: Medicare PPO | Admitting: Physician Assistant

## 2015-03-22 ENCOUNTER — Encounter: Payer: Self-pay | Admitting: Physician Assistant

## 2015-03-22 VITALS — BP 158/76 | HR 65 | Ht 72.0 in | Wt 188.5 lb

## 2015-03-22 DIAGNOSIS — R079 Chest pain, unspecified: Secondary | ICD-10-CM

## 2015-03-22 DIAGNOSIS — I480 Paroxysmal atrial fibrillation: Secondary | ICD-10-CM

## 2015-03-22 DIAGNOSIS — I10 Essential (primary) hypertension: Secondary | ICD-10-CM

## 2015-03-22 DIAGNOSIS — I4891 Unspecified atrial fibrillation: Secondary | ICD-10-CM | POA: Diagnosis not present

## 2015-03-22 DIAGNOSIS — M7989 Other specified soft tissue disorders: Secondary | ICD-10-CM

## 2015-03-22 DIAGNOSIS — E785 Hyperlipidemia, unspecified: Secondary | ICD-10-CM

## 2015-03-22 DIAGNOSIS — R0789 Other chest pain: Secondary | ICD-10-CM

## 2015-03-22 MED ORDER — FUROSEMIDE 20 MG PO TABS: 20.0000 mg | ORAL_TABLET | Freq: Every day | ORAL | Status: DC

## 2015-03-22 NOTE — Patient Instructions (Signed)
Medication Instructions:  Your physician has recommended you make the following change in your medication:  START taking lasix 20mg  once per day   Labwork: BMET  Testing/Procedures: none  Follow-Up: Your physician recommends that you schedule a follow-up appointment in: three months with Eula Listenyan Dunn, PA-C   Any Other Special Instructions Will Be Listed Below (If Applicable).     If you need a refill on your cardiac medications before your next appointment, please call your pharmacy.

## 2015-03-22 NOTE — Progress Notes (Signed)
Cardiology Hospital Follow Up Note:   Date of Encounter: 03/22/2015  ID: Darryl Leonard., DOB October 20, 1923, MRN [id number]  PCP: [name], MD Primary Cardiologist: Dr. Kirke Corin, MD  Chief Complaint  Patient presents with  . other    F/u hospital due to chest pain/afib. Meds reviewed verbally with pt.     HPI:  81 year old male with history of DM2, HTN, HLD, rheumatic fever, PVD, BPH, and OA who presents for hospital follow up after recent admission to [facility] from 9/28 to 9/30 for new onset Afib with RVR occuring in the ED and atypical chest pain occuring at rest.   He had no previously known cardiac history and had never seen a cardiologist before. Never had chest pain or tachy-palpitations before. Has been followed for PVD by his PCP. On pravastatin, no imaging available. Prior smoker in his younger years - none since the 68's.   After going to get the newspaper and sitting down to read it on 9/28 he began to develop upper chest pain that radiated to his neck and bilateral jaws. Some associated chills. No associated diaphoresis, SOB, nausea, vomiting, presyncope, or syncope. No associated palpitations. Pain was worse with deep inspiration. Pain lasted for 30 minutes. Pain resolved with SL NTG by EMS. He could hear his heart beat, but could not feel any associated palpitations. No recent illnesses, infections, or increased stressors. Weight had been stable. No increased LEE, orthopnea, or early satiety.   Upon his arrival to [facility] he was found to have negative troponin x 3. CXR with COPD, mild peripheral basilar reticular interstitial opacities, suggesting ILD. Advised chest CT. K+ 4.2, SCr 1.50-->1.66, hgb 11.6. In the ED he developed new onset Afib with RVR. Initial ECG showed NSR, heart rate of 95 bpm at 17:46. Follow up ECG at 20:54 showed Afib with RVR with heart rate of 134. He was treated with Cardizem gtt without a bolus. Echo on 9/29 showed EF 50-55%, normal wall  motion, GR1DD, mild biatrial enlargement, PASP 45 mm Hg. On telemetry he was in Afib with RVR with heart rates in the 130's to 140's until 4:58 AM when he converted to sinus rhythm with frequent PACs with heart rates in the 70's to 90's.    Since his discharge he has done well. He was discharged on dual therapy, Eliquis and aspirin. His wife did not think this was correct and appropriately stopped the aspirin as there was no indication for dual therapy in him. He has not had any further episodes of chest pain. No tachy-palpitations. He does not live an extraordinary active lifestyle, but does go to the flea market weekly with his wife to walk around and has tolerated this without issues. His appetite has significantly improved. His lower extremity swelling is at baseline. He is not currently wearing compression hose 2/2 wound on the anterior portion of his leg. He is tolerating his Eliquis without issues and has not missed any doses.      Past Medical History  Diagnosis Date  . BENIGN PROSTATIC HYPERTROPHY 11/25/2007  . DIABETES MELLITUS, TYPE II 11/10/2006  . HYPERLIPIDEMIA 11/10/2006  . HYPERTENSION 12/13/2008  . OSTEOARTHRITIS 11/10/2006  . Rheumatic fever   . PVD (peripheral vascular disease) (HCC)   . PAF (paroxysmal atrial fibrillation) (HCC)     a. on eliquis  . Pulmonary HTN (HCC)     a. echo 01/2015: EF EF 50-55%, normal wall motion, GR1DD, mild biatrial enlargement, PASP 45 mm Hg  : Past  Surgical History  Procedure Laterality Date  . Mastoid surgery    . Tendon repair      left foot  . Cataract extraction    : Family History  Problem Relation Age of Onset  . Heart disease Mother   . Cancer Father     lung ca  :  reports that he has never smoked. He has never used smokeless tobacco. He reports that he drinks alcohol. He reports that he does not use illicit drugs.:   Allergies:  Allergies  Allergen Reactions  . Tramadol     Tingling     Home Medications:  Current  Outpatient Prescriptions  Medication Sig Dispense Refill  . ACETAMINOPHEN PO Take by mouth as needed.    Marland Kitchen apixaban (ELIQUIS) 5 MG TABS tablet Take 1 tablet (5 mg total) by mouth 2 (two) times daily. 60 tablet 1  . benazepril (LOTENSIN) 10 MG tablet Take 1 tablet (10 mg total) by mouth daily. 60 tablet 1  . donepezil (ARICEPT) 5 MG tablet Take 1 tablet (5 mg total) by mouth at bedtime. 90 tablet 2  . metFORMIN (GLUCOPHAGE) 1000 MG tablet Take 1 tablet (1,000 mg total) by mouth 2 (two) times daily with a meal. 90 tablet 0  . metoprolol tartrate (LOPRESSOR) 50 MG tablet Take 1 tablet (50 mg total) by mouth 2 (two) times daily. 60 tablet 1  . pravastatin (PRAVACHOL) 20 MG tablet Take 1 tablet (20 mg total) by mouth at bedtime. 90 tablet 0  . TRAVATAN Z 0.004 % SOLN ophthalmic solution Place 1 drop into both eyes at bedtime.     . furosemide (LASIX) 20 MG tablet Take 1 tablet (20 mg total) by mouth daily. 30 tablet 3   No current facility-administered medications for this visit.     Review of Systems:  Review of Systems  Constitutional: Negative for fever, chills, weight loss, malaise/fatigue and diaphoresis.  HENT: Negative for congestion.   Eyes: Negative for discharge and redness.  Respiratory: Negative for cough, hemoptysis, sputum production, shortness of breath and wheezing.   Cardiovascular: Positive for leg swelling. Negative for chest pain, palpitations, orthopnea, claudication and PND.  Gastrointestinal: Negative for nausea, vomiting, blood in stool and melena.  Genitourinary: Negative for hematuria.  Musculoskeletal: Negative for myalgias and falls.  Skin: Negative for rash.  Neurological: Negative for sensory change, speech change, focal weakness, loss of consciousness and weakness.  Endo/Heme/Allergies: Does not bruise/bleed easily.  Psychiatric/Behavioral: The patient is not nervous/anxious.      Physical Exam:  Blood pressure 158/76, pulse 65, height 6' (1.829 m),  weight 188 lb 8 oz (85.503 kg). BMI: Body mass index is 25.56 kg/(m^2). General: Pleasant, NAD. Psych: Normal affect. Responds to questions with normal affect.  Neuro: Alert and oriented X 3. Moves all extremities spontaneously. HEENT: Normocephalic, atraumatic. EOM intact bilaterally. Sclera anicteric.  Neck: Trachea midline. Supple without bruits or JVD. Lungs:  Respirations regular and unlabored, CTA bilaterally without wheezing, crackles, or rhonchi.  Heart: RRR, normal s3, s4. No murmurs, rubs, or gallops.  Abdomen: Soft, non-tender, non-distended, BS + x 4.  Extremities: No clubbing or cyanosis. 1+ pitting edema to the bilateral mid shins.    Accessory Clinical Findings:  EKG: NSR with marked sinus arrhythmia, 65 bpm, nonspecific st/t changes aVL  Recent Labs: 02/08/2015: ALT 6* 02/09/2015: Hemoglobin 10.6*; Platelets 213 02/10/2015: BUN 29*; Creatinine, Ser 1.34*; Potassium 3.7; Sodium 140     Component Value Date/Time   CHOL 173 02/16/2014 1021  TRIG 125.0 02/16/2014 1021   HDL 46.50 02/16/2014 1021   CHOLHDL 4 02/16/2014 1021   VLDL 25.0 02/16/2014 1021   LDLCALC 102* 02/16/2014 1021   LDLDIRECT 135.4 11/10/2006 0000    Weights: Wt Readings from Last 3 Encounters:  03/22/15 188 lb 8 oz (85.503 kg)  03/06/15 183 lb (83.008 kg)  02/09/15 178 lb 5.6 oz (80.9 kg)    CrCl cannot be calculated (Patient has no serum creatinine result on file.).   Other studies Reviewed: Additional studies/ records that were reviewed today include: [facility] admission.  Assessment & Plan:  1. PAF:  -Newly diagnosed on 02/2015  -Maintaining sinus rhythm  -CHADSVASc at least 5 (HTN, DM2, age x 2, vascular disease)  -Continue Eliquis 5 mg bid  -Discharge SCr borderline, but less than 1.5, will recheck bmet today. If SCr remains less than 1.5 continue Eliquis 5 mg bid, if SCr >1.5 he will need to decrease his dose to 2.5 mg bid  -Continue metoprolol 50 mg bid   2. Atypical chest pain:  -No  further chest pain -If recurrent plan for Lexiscan Myoview   3. HTN:  -Slightly elevated today -Add Lasix 20 mg daily as below -Metoprolol as above  -Benazepril 10 mg daily   4. HLD: -Pravastatin 20 mg qhs  5. Lower extremity swelling: -Usually wears compression hose, but 2/2 wound on anterior portion of left leg he has not been wearing these -He will start to wear Tommie Copper compression hose -Lasix 20 mg daily  Dispo: -Follow up 2 months  Current medicines are reviewed at length with the patient today.  The patient did not have any concerns regarding medicines.   Eula Listenyan Denisa Enterline, PA-C [facility] [address] [place], [place] [address] [phone] [facility] 03/22/2015, 4:15 PM

## 2015-03-23 LAB — BASIC METABOLIC PANEL
BUN/Creatinine Ratio: 14 (ref 10–22)
BUN: 19 mg/dL (ref 8–27)
CO2: 23 mmol/L (ref 18–29)
CREATININE: 1.34 mg/dL — AB (ref 0.76–1.27)
Calcium: 8.3 mg/dL — ABNORMAL LOW (ref 8.6–10.2)
Chloride: 103 mmol/L (ref 97–106)
GFR calc Af Amer: 54 mL/min/{1.73_m2} — ABNORMAL LOW (ref >59)
GFR calc non Af Amer: 47 mL/min/{1.73_m2} — ABNORMAL LOW (ref >59)
GLUCOSE: 103 mg/dL — AB (ref 65–99)
Potassium: 4.4 mmol/L (ref 3.5–5.2)
SODIUM: 142 mmol/L (ref 136–144)

## 2015-05-01 ENCOUNTER — Telehealth: Payer: Self-pay | Admitting: *Deleted

## 2015-05-01 ENCOUNTER — Other Ambulatory Visit: Payer: Self-pay | Admitting: *Deleted

## 2015-05-01 MED ORDER — METOPROLOL TARTRATE 50 MG PO TABS: 50.0000 mg | ORAL_TABLET | Freq: Two times a day (BID) | ORAL | Status: DC

## 2015-05-01 NOTE — Telephone Encounter (Signed)
°*  STAT* If patient is at the pharmacy, call can be transferred to refill team.   1. Which medications need to be refilled? (please list name of each medication and dose if known) metoprolol   2. Which pharmacy/location (including street and city if local pharmacy) is medication to be sent to? [facility] [address]  3. Do they need a 30 day or 90 day supply? 90 day

## 2015-05-01 NOTE — Telephone Encounter (Signed)
Rx sent to local pharmacy Metoprolol #180 R#3.

## 2015-05-29 ENCOUNTER — Other Ambulatory Visit: Payer: Self-pay | Admitting: Internal Medicine

## 2015-06-06 ENCOUNTER — Telehealth: Payer: Self-pay | Admitting: *Deleted

## 2015-06-06 NOTE — Telephone Encounter (Signed)
S/w pt wife, Rhunette Croft, of Ryan's recommendations. Wife states she does not know if she can get him to go as "he is stubborn". Stressed importance of having pt evaluated in ED and she states she will "try and have him go". Wife verbalized understanding of urgency with no further questions.

## 2015-06-06 NOTE — Telephone Encounter (Signed)
Pt wife calling asking since patient has been released from hospital  They changed his metoprolol to 100 mg She thinks it may be a bit too much Would like to know if they can stop it until pt comes to see [place] in march Or if this could be a combination of the drugs He is confused/ having loose bowels/ and it cold Please advise

## 2015-06-06 NOTE — Telephone Encounter (Signed)
S/w wife who reports pt has been having intermittent diarrhea since September. They did not mention this to Eula Listen at November office visit because "it wasn't a big deal at the time". Reports pt is always cold. Informed wife this could be d/t blood thinner as he is taking eliquis BID. Reports pt has been confused. Pt prescribed aricept  in October by Dr. Amador Cunas. Wife reports they did not feel it helped so he stopped taking it after two weeks. She feels it made his confusion worse. Reports he "acts goofy". Upon reviewing medication, wife states he is not taking lasix as she felt it made diarrhea worse and swelling in his legs did not improve.  She states no new medications added. She feels metoprolol is causing the diarrhea and would like to have pt d/c med. They do not monitor BP daily but she states daughter "keeps a check on it". States it "fluctuates" and is unable to provide readings.  Advised wife to have pt continue all current medications as prescribed until she hears back from [place] and to contact PCP tomorrow regarding aricept.  Wife verbalized understanding and is agreeable w/plan. Forward to [facility] to advise regarding another BP medication.

## 2015-06-06 NOTE — Telephone Encounter (Signed)
If patient has been having diarrhea this long advise he go to the ED for evaluation and labs. Metoprolol can cause diarrhea, though this sounds like more than that. Patient needs to be seen by PCP vs ED.

## 2015-06-13 ENCOUNTER — Ambulatory Visit (INDEPENDENT_AMBULATORY_CARE_PROVIDER_SITE_OTHER): Payer: Medicare PPO | Admitting: Internal Medicine

## 2015-06-13 ENCOUNTER — Encounter: Payer: Self-pay | Admitting: Internal Medicine

## 2015-06-13 VITALS — BP 140/70 | HR 71 | Temp 98.0°F | Resp 28

## 2015-06-13 DIAGNOSIS — M150 Primary generalized (osteo)arthritis: Secondary | ICD-10-CM

## 2015-06-13 DIAGNOSIS — R531 Weakness: Secondary | ICD-10-CM | POA: Diagnosis not present

## 2015-06-13 DIAGNOSIS — M7989 Other specified soft tissue disorders: Secondary | ICD-10-CM

## 2015-06-13 DIAGNOSIS — E7800 Pure hypercholesterolemia, unspecified: Secondary | ICD-10-CM | POA: Diagnosis not present

## 2015-06-13 DIAGNOSIS — E1151 Type 2 diabetes mellitus with diabetic peripheral angiopathy without gangrene: Secondary | ICD-10-CM

## 2015-06-13 DIAGNOSIS — I10 Essential (primary) hypertension: Secondary | ICD-10-CM

## 2015-06-13 DIAGNOSIS — I480 Paroxysmal atrial fibrillation: Secondary | ICD-10-CM | POA: Diagnosis not present

## 2015-06-13 DIAGNOSIS — M159 Polyosteoarthritis, unspecified: Secondary | ICD-10-CM

## 2015-06-13 MED ORDER — METFORMIN HCL 1000 MG PO TABS: 1000.0000 mg | ORAL_TABLET | Freq: Every day | ORAL | Status: DC

## 2015-06-13 MED ORDER — METOPROLOL TARTRATE 50 MG PO TABS: 25.0000 mg | ORAL_TABLET | Freq: Two times a day (BID) | ORAL | Status: DC

## 2015-06-13 NOTE — Progress Notes (Signed)
Pre visit review using our clinic review tool, if applicable. No additional management support is needed unless otherwise documented below in the visit note. 

## 2015-06-13 NOTE — Progress Notes (Signed)
Subjective:    Patient ID: Darryl Leonard., male    DOB: 05/27/1923, 82 y.o.   MRN: [id number]  HPI  82 year old patient who was hospitalized in September of last year for atrial fibrillation with rapid ventricular response.  He now is on chronic anticoagulation.  Since the hospital discharge, he has become progressively weaker.  There is been some cognitive decline.  Evaluation included a brain CT scan in September that revealed significant diffuse atrophy.  He has been on diuretic therapy due to lower extremity edema.  This has been discontinued.  When seen here about one month ago he was placed on Aricept due to cognitive impairment.  There is been no improvement.  Cardiology has added metoprolol and the family feels he has had worsening weakness  Past Medical History  Diagnosis Date  . BENIGN PROSTATIC HYPERTROPHY 11/25/2007  . DIABETES MELLITUS, TYPE II 11/10/2006  . HYPERLIPIDEMIA 11/10/2006  . HYPERTENSION 12/13/2008  . OSTEOARTHRITIS 11/10/2006  . Rheumatic fever   . PVD (peripheral vascular disease) (HCC)   . PAF (paroxysmal atrial fibrillation) (HCC)     a. on eliquis  . Pulmonary HTN (HCC)     a. echo 01/2015: EF EF 50-55%, normal wall motion, GR1DD, mild biatrial enlargement, PASP 45 mm Hg  . Swelling of lower extremity     Social History   Social History  . Marital Status: Married    Spouse Name: N/A  . Number of Children: N/A  . Years of Education: N/A   Occupational History  . Not on file.   Social History Main Topics  . Smoking status: Never Smoker   . Smokeless tobacco: Never Used  . Alcohol Use: Yes  . Drug Use: No  . Sexual Activity: Not on file   Other Topics Concern  . Not on file   Social History Narrative    Past Surgical History  Procedure Laterality Date  . Mastoid surgery    . Tendon repair      left foot  . Cataract extraction      Family History  Problem Relation Age of Onset  . Heart disease Mother   . Cancer Father     lung ca     Allergies  Allergen Reactions  . Tramadol     Tingling    Current Outpatient Prescriptions on File Prior to Visit  Medication Sig Dispense Refill  . apixaban (ELIQUIS) 5 MG TABS tablet Take 1 tablet (5 mg total) by mouth 2 (two) times daily. 60 tablet 1  . metFORMIN (GLUCOPHAGE) 1000 MG tablet Take 1 tablet (1,000 mg total) by mouth 2 (two) times daily with a meal. 90 tablet 0  . metoprolol (LOPRESSOR) 50 MG tablet Take 1 tablet (50 mg total) by mouth 2 (two) times daily. 180 tablet 3  . pravastatin (PRAVACHOL) 20 MG tablet TAKE 1 TABLET AT BEDTIME 90 tablet 0  . TRAVATAN Z 0.004 % SOLN ophthalmic solution Place 1 drop into both eyes at bedtime.     . ACETAMINOPHEN PO Take by mouth as needed. Reported on 06/13/2015     No current facility-administered medications on file prior to visit.    BP 140/70 mmHg  Pulse 71  Temp(Src) 98 F (36.7 C) (Oral)  Resp 28  Wt   SpO2 97%      Review of Systems  Constitutional: Positive for activity change. Negative for fever, chills, appetite change and fatigue.  HENT: Negative for congestion, dental problem, ear pain, hearing loss, sore throat,  tinnitus, trouble swallowing and voice change.   Eyes: Negative for pain, discharge and visual disturbance.  Respiratory: Negative for cough, chest tightness, wheezing and stridor.   Cardiovascular: Negative for chest pain, palpitations and leg swelling.  Gastrointestinal: Negative for nausea, vomiting, abdominal pain, diarrhea, constipation, blood in stool and abdominal distention.  Genitourinary: Negative for urgency, hematuria, flank pain, discharge, difficulty urinating and genital sores.  Musculoskeletal: Positive for back pain and gait problem. Negative for myalgias, joint swelling, arthralgias and neck stiffness.  Skin: Negative for rash.  Neurological: Negative for dizziness, syncope, speech difficulty, weakness, numbness and headaches.  Hematological: Negative for adenopathy. Does not  bruise/bleed easily.  Psychiatric/Behavioral: Positive for confusion and decreased concentration. Negative for behavioral problems and dysphoric mood. The patient is not nervous/anxious.        Objective:   Physical Exam  Constitutional: He is oriented to person, place, and time. He appears well-developed.  Alert voices no complaints.  Blood pressure 130/70 Pulse 70 Weak Wheelchair bound   HENT:  Head: Normocephalic.  Right Ear: External ear normal.  Left Ear: External ear normal.  Eyes: Conjunctivae and EOM are normal.  Neck: Normal range of motion.  Cardiovascular: Normal rate and normal heart sounds.   Controlled ventricular response  Pulmonary/Chest: Breath sounds normal.  Abdominal: Bowel sounds are normal.  Musculoskeletal: Normal range of motion. He exhibits edema. He exhibits no tenderness.  Prominent lower extremity edema  Neurological: He is alert and oriented to person, place, and time.  Psychiatric: He has a normal mood and affect. His behavior is normal.          Assessment & Plan:  General decline in aspects of daily living Cerebral atrophy/cognitive dysfunction.  Continue Aricept Atrial fibrillation.  Ventricular response is well controlled.  Will decrease metoprolol to 25 mg twice daily Diabetes.  Check hemoglobin A1c Hypertension, controlled.  We'll check electrolytes  Check updated lab including CBC, sedimentation rate, TSH

## 2015-06-13 NOTE — Patient Instructions (Signed)
Limit your sodium (Salt) intake    It is important that you exercise regularly, at least 20 minutes 3 to 4 times per week.  If you develop chest pain or shortness of breath seek  medical attention.  Return in one month for follow-up  

## 2015-06-14 LAB — CBC WITH DIFFERENTIAL/PLATELET
BASOS PCT: 1.8 % (ref 0.0–3.0)
Basophils Absolute: 0.1 10*3/uL (ref 0.0–0.1)
EOS ABS: 0.3 10*3/uL (ref 0.0–0.7)
EOS PCT: 3.3 % (ref 0.0–5.0)
HCT: 35.8 % — ABNORMAL LOW (ref 39.0–52.0)
Hemoglobin: 11.6 g/dL — ABNORMAL LOW (ref 13.0–17.0)
LYMPHS ABS: 1.3 10*3/uL (ref 0.7–4.0)
Lymphocytes Relative: 15.4 % (ref 12.0–46.0)
MCHC: 32.3 g/dL (ref 30.0–36.0)
MCV: 90.2 fl (ref 78.0–100.0)
MONO ABS: 0.7 10*3/uL (ref 0.1–1.0)
Monocytes Relative: 8.7 % (ref 3.0–12.0)
NEUTROS PCT: 70.8 % (ref 43.0–77.0)
Neutro Abs: 6 10*3/uL (ref 1.4–7.7)
Platelets: 292 10*3/uL (ref 150.0–400.0)
RBC: 3.97 Mil/uL — ABNORMAL LOW (ref 4.22–5.81)
RDW: 15.4 % (ref 11.5–15.5)
WBC: 8.5 10*3/uL (ref 4.0–10.5)

## 2015-06-14 LAB — COMPREHENSIVE METABOLIC PANEL
ALK PHOS: 75 U/L (ref 39–117)
ALT: 3 U/L (ref 0–53)
AST: 9 U/L (ref 0–37)
Albumin: 3.9 g/dL (ref 3.5–5.2)
BILIRUBIN TOTAL: 0.3 mg/dL (ref 0.2–1.2)
BUN: 22 mg/dL (ref 6–23)
CALCIUM: 9.3 mg/dL (ref 8.4–10.5)
CO2: 25 meq/L (ref 19–32)
CREATININE: 1.51 mg/dL — AB (ref 0.40–1.50)
Chloride: 103 mEq/L (ref 96–112)
GFR: 46.37 mL/min — ABNORMAL LOW (ref >60.00)
Glucose, Bld: 120 mg/dL — ABNORMAL HIGH (ref 70–99)
Potassium: 4.8 mEq/L (ref 3.5–5.1)
Sodium: 139 mEq/L (ref 135–145)
TOTAL PROTEIN: 6.3 g/dL (ref 6.0–8.3)

## 2015-06-14 LAB — SEDIMENTATION RATE: Sed Rate: 35 mm/hr — ABNORMAL HIGH (ref 0–22)

## 2015-06-14 LAB — HEMOGLOBIN A1C: HEMOGLOBIN A1C: 6.3 % (ref 4.6–6.5)

## 2015-06-14 LAB — TSH: TSH: 3.12 u[IU]/mL (ref 0.35–4.50)

## 2015-06-26 ENCOUNTER — Other Ambulatory Visit: Payer: Self-pay

## 2015-06-26 ENCOUNTER — Ambulatory Visit: Payer: Medicare PPO | Admitting: Cardiovascular Disease

## 2015-06-26 MED ORDER — APIXABAN 5 MG PO TABS: 5.0000 mg | ORAL_TABLET | Freq: Two times a day (BID) | ORAL | Status: DC

## 2015-07-10 ENCOUNTER — Encounter: Payer: Self-pay | Admitting: Internal Medicine

## 2015-07-10 ENCOUNTER — Ambulatory Visit (INDEPENDENT_AMBULATORY_CARE_PROVIDER_SITE_OTHER): Payer: Medicare PPO | Admitting: Internal Medicine

## 2015-07-10 VITALS — BP 118/70 | HR 56 | Temp 98.0°F | Resp 20 | Ht 72.0 in | Wt 171.0 lb

## 2015-07-10 DIAGNOSIS — G319 Degenerative disease of nervous system, unspecified: Secondary | ICD-10-CM

## 2015-07-10 DIAGNOSIS — I480 Paroxysmal atrial fibrillation: Secondary | ICD-10-CM | POA: Diagnosis not present

## 2015-07-10 DIAGNOSIS — I10 Essential (primary) hypertension: Secondary | ICD-10-CM

## 2015-07-10 DIAGNOSIS — R4189 Other symptoms and signs involving cognitive functions and awareness: Secondary | ICD-10-CM | POA: Diagnosis not present

## 2015-07-10 DIAGNOSIS — E1151 Type 2 diabetes mellitus with diabetic peripheral angiopathy without gangrene: Secondary | ICD-10-CM

## 2015-07-10 NOTE — Patient Instructions (Signed)
Limit your sodium (Salt) intake    It is important that you exercise regularly, at least 20 minutes 3 to 4 times per week.  If you develop chest pain or shortness of breath seek  medical attention.  Return in 6 months for follow-up  

## 2015-07-10 NOTE — Progress Notes (Signed)
Subjective:    Patient ID: Darryl Estimable., male    DOB: 06-22-1923, 82 y.o.   MRN: [id number]  HPI  Wt Readings from Last 3 Encounters:  07/10/15 171 lb (77.565 kg)  03/22/15 188 lb 8 oz (85.503 kg)  03/06/15 183 lb (83.13 kg)    82 year old patient who is seen today in follow-up.   He was seen last month with increasing fatigue and cognitive impairment. He was placed on Aricept a month prior, without much benefit. He was hospitalized in the fall for paroxysmal atrial fibrillation with rapid ventricular response. One month ago metoprolol was down titrated.  The patient feels much improved.  He was wheelchair-bound 1 month ago and now is back to baseline and using a cane   Past Medical History  Diagnosis Date  . BENIGN PROSTATIC HYPERTROPHY 11/25/2007  . DIABETES MELLITUS, TYPE II 11/10/2006  . HYPERLIPIDEMIA 11/10/2006  . HYPERTENSION 12/13/2008  . OSTEOARTHRITIS 11/10/2006  . Rheumatic fever   . PVD (peripheral vascular disease) (HCC)   . PAF (paroxysmal atrial fibrillation) (HCC)     a. on eliquis  . Pulmonary HTN (HCC)     a. echo 01/2015: EF EF 50-55%, normal wall motion, GR1DD, mild biatrial enlargement, PASP 45 mm Hg  . Swelling of lower extremity     Social History   Social History  . Marital Status: Married    Spouse Name: N/A  . Number of Children: N/A  . Years of Education: N/A   Occupational History  . Not on file.   Social History Main Topics  . Smoking status: Never Smoker   . Smokeless tobacco: Never Used  . Alcohol Use: Yes  . Drug Use: No  . Sexual Activity: Not on file   Other Topics Concern  . Not on file   Social History Narrative    Past Surgical History  Procedure Laterality Date  . Mastoid surgery    . Tendon repair      left foot  . Cataract extraction      Family History  Problem Relation Age of Onset  . Heart disease Mother   . Cancer Father     lung ca    Allergies  Allergen Reactions  . Tramadol     Tingling     Current Outpatient Prescriptions on File Prior to Visit  Medication Sig Dispense Refill  . ACETAMINOPHEN PO Take by mouth as needed. Reported on 06/13/2015    . apixaban (ELIQUIS) 5 MG TABS tablet Take 1 tablet (5 mg total) by mouth 2 (two) times daily. 60 tablet 3  . benazepril-hydrochlorthiazide (LOTENSIN HCT) 10-12.5 MG tablet     . metFORMIN (GLUCOPHAGE) 1000 MG tablet Take 1 tablet (1,000 mg total) by mouth daily with breakfast. 90 tablet 0  . metoprolol (LOPRESSOR) 50 MG tablet Take 0.5 tablets (25 mg total) by mouth 2 (two) times daily. 180 tablet 3  . pravastatin (PRAVACHOL) 20 MG tablet TAKE 1 TABLET AT BEDTIME 90 tablet 0  . TRAVATAN Z 0.004 % SOLN ophthalmic solution Place 1 drop into both eyes at bedtime.      No current facility-administered medications on file prior to visit.    BP 118/70 mmHg  Pulse 56  Temp(Src) 98 F (36.7 C) (Oral)  Resp 20  Ht 6' (1.829 m)  Wt 171 lb (77.565 kg)  BMI 23.19 kg/m2  SpO2 97%      Review of Systems  Constitutional: Negative for fever, chills, appetite change and fatigue.  HENT: Negative for congestion, dental problem, ear pain, hearing loss, sore throat, tinnitus, trouble swallowing and voice change.   Eyes: Negative for pain, discharge and visual disturbance.  Respiratory: Negative for cough, chest tightness, wheezing and stridor.   Cardiovascular: Negative for chest pain, palpitations and leg swelling.  Gastrointestinal: Negative for nausea, vomiting, abdominal pain, diarrhea, constipation, blood in stool and abdominal distention.  Genitourinary: Negative for urgency, hematuria, flank pain, discharge, difficulty urinating and genital sores.  Musculoskeletal: Positive for gait problem. Negative for myalgias, back pain, joint swelling, arthralgias and neck stiffness.  Skin: Negative for rash.  Neurological: Positive for weakness. Negative for dizziness, syncope, speech difficulty, numbness and headaches.  Hematological:  Negative for adenopathy. Does not bruise/bleed easily.  Psychiatric/Behavioral: Positive for confusion. Negative for behavioral problems and dysphoric mood. The patient is not nervous/anxious.        Objective:   Physical Exam  Constitutional: He is oriented to person, place, and time. He appears well-developed.  Blood pressure 118/70  HENT:  Head: Normocephalic.  Right Ear: External ear normal.  Left Ear: External ear normal.  Eyes: Conjunctivae and EOM are normal.  Neck: Normal range of motion.  Cardiovascular: Normal rate, regular rhythm and normal heart sounds.    Pulse 60 and regular  Pulmonary/Chest: Breath sounds normal.  Abdominal: Bowel sounds are normal.  Musculoskeletal: Normal range of motion. He exhibits no edema or tenderness.  Neurological: He is alert and oriented to person, place, and time.  Psychiatric: He has a normal mood and affect. His behavior is normal.          Assessment & Plan:   Paroxysmal atrial fibrillation.  Stable.  Continue anticoagulation Confusion/weakness-possibly aggravated by beta blocker therapy.  Patient has done much better on a dose reduction.  Blood pressure is low normal and pulse is 60.  We'll continue present regimen Cerebral atrophy Diabetes mellitus.  No change in therapy  Recheck 4-6 months

## 2015-07-10 NOTE — Progress Notes (Signed)
Pre visit review using our clinic review tool, if applicable. No additional management support is needed unless otherwise documented below in the visit note. 

## 2015-07-21 ENCOUNTER — Ambulatory Visit: Payer: Medicare PPO | Admitting: Cardiovascular Disease

## 2015-08-08 ENCOUNTER — Ambulatory Visit: Payer: Medicare PPO | Admitting: Family Medicine

## 2015-08-14 ENCOUNTER — Ambulatory Visit (INDEPENDENT_AMBULATORY_CARE_PROVIDER_SITE_OTHER): Payer: Medicare PPO | Admitting: Cardiovascular Disease

## 2015-08-14 ENCOUNTER — Encounter: Payer: Self-pay | Admitting: Cardiovascular Disease

## 2015-08-14 VITALS — BP 120/70 | HR 68 | Ht 72.0 in | Wt 176.5 lb

## 2015-08-14 DIAGNOSIS — I10 Essential (primary) hypertension: Secondary | ICD-10-CM | POA: Diagnosis not present

## 2015-08-14 DIAGNOSIS — I4891 Unspecified atrial fibrillation: Secondary | ICD-10-CM

## 2015-08-14 MED ORDER — APIXABAN 2.5 MG PO TABS: 2.5000 mg | ORAL_TABLET | Freq: Two times a day (BID) | ORAL | Status: DC

## 2015-08-14 NOTE — Patient Instructions (Signed)
Medication Instructions:  Your physician has recommended you make the following change in your medication:  DECREASE eliquis to 2.5mg  twice daily   Labwork: none  Testing/Procedures: None  Follow-Up: Your physician wants you to follow-up in: six months with Dr. Kirke CorinArida.  You will receive a reminder letter in the mail two months in advance. If you don't receive a letter, please call our office to schedule the follow-up appointment.   Any Other Special Instructions Will Be Listed Below (If Applicable).     If you need a refill on your cardiac medications before your next appointment, please call your pharmacy.

## 2015-08-14 NOTE — Progress Notes (Signed)
Cardiology Office Note   Date:  08/14/2015   ID:  Darryl Estimable., DOB 01-May-1924, MRN [id number]  PCP:  [name], MD  Cardiologist:   Lorine Bears, MD   Chief Complaint  Patient presents with  . other    3 month follow up. Meds reviewed by the patient verbally. "doing well."       History of Present Illness: Darryl Leonard. is a 82 y.o. male who presents for  A follow-up visit regarding versus malignant atrial fibrillation. He has multiple chronic medical conditions that include type 2 diabetes, hypertension, and hyperlipidemia. He had documented atrial fibrillation with rapid ventricular response in September 2016. Echocardiogram showed an ejection fraction of 50-55% with mild pulmonary hypertension. He was started on metoprolol and has been in sinus rhythm since then. He is on long-term anticoagulation with Eliquis. He did have significant diarrhea that improved after decreasing the dose of metoprolol. She has been doing very well and denies chest pain, shortness of breath or  palpitations. He has chronic leg edema.   Past Medical History  Diagnosis Date  . BENIGN PROSTATIC HYPERTROPHY 11/25/2007  . DIABETES MELLITUS, TYPE II 11/10/2006  . HYPERLIPIDEMIA 11/10/2006  . HYPERTENSION 12/13/2008  . OSTEOARTHRITIS 11/10/2006  . Rheumatic fever   . PVD (peripheral vascular disease) (HCC)   . PAF (paroxysmal atrial fibrillation) (HCC)     a. on eliquis  . Pulmonary HTN (HCC)     a. echo 01/2015: EF EF 50-55%, normal wall motion, GR1DD, mild biatrial enlargement, PASP 45 mm Hg  . Swelling of lower extremity     Past Surgical History  Procedure Laterality Date  . Mastoid surgery    . Tendon repair      left foot  . Cataract extraction       Current Outpatient Prescriptions  Medication Sig Dispense Refill  . ACETAMINOPHEN PO Take by mouth as needed. Reported on 06/13/2015    . benazepril-hydrochlorthiazide (LOTENSIN HCT) 10-12.5 MG tablet Take 1 tablet by  mouth daily.     . metFORMIN (GLUCOPHAGE) 1000 MG tablet Take 1 tablet (1,000 mg total) by mouth daily with breakfast. 90 tablet 0  . metoprolol (LOPRESSOR) 50 MG tablet Take 0.5 tablets (25 mg total) by mouth 2 (two) times daily. 180 tablet 3  . pravastatin (PRAVACHOL) 20 MG tablet TAKE 1 TABLET AT BEDTIME 90 tablet 0  . TRAVATAN Z 0.004 % SOLN ophthalmic solution Place 1 drop into both eyes at bedtime.     Marland Kitchen apixaban (ELIQUIS) 2.5 MG TABS tablet Take 1 tablet (2.5 mg total) by mouth 2 (two) times daily. 60 tablet 6   No current facility-administered medications for this visit.    Allergies:   Tramadol    Social History:  The patient  reports that he has never smoked. He has never used smokeless tobacco. He reports that he drinks alcohol. He reports that he does not use illicit drugs.   Family History:  The patient's family history includes Cancer in his father; Heart disease in his mother.    ROS:  Please see the history of present illness.   Otherwise, review of systems are positive for none.   All other systems are reviewed and negative.    PHYSICAL EXAM: VS:  BP 120/70 mmHg  Pulse 68  Ht 6' (1.829 m)  Wt 176 lb 8 oz (80.06 kg)  BMI 23.93 kg/m2 , BMI Body mass index is 23.93 kg/(m^2). GEN: Well nourished, well developed, in no acute  distress HEENT: normal Neck: no JVD, carotid bruits, or masses Cardiac: RRR; no murmurs, rubs, or gallops. Moderate bilateral leg edema mostly nonpitting. Respiratory:  clear to auscultation bilaterally, normal work of breathing GI: soft, nontender, nondistended, + BS MS: no deformity or atrophy Skin: warm and dry, no rash Neuro:  Strength and sensation are intact Psych: euthymic mood, full affect   EKG:  EKG is ordered today. The ekg ordered today demonstrates normal sinus rhythm with no significant ST or T wave changes   Recent Labs: 06/13/2015: ALT 3; BUN 22; Creatinine, Ser 1.51*; Hemoglobin 11.6*; Platelets 292.0; Potassium 4.8; Sodium  139; TSH 3.12    Lipid Panel    Component Value Date/Time   CHOL 173 02/16/2014 1021   TRIG 125.0 02/16/2014 1021   HDL 46.50 02/16/2014 1021   CHOLHDL 4 02/16/2014 1021   VLDL 25.0 02/16/2014 1021   LDLCALC 102* 02/16/2014 1021   LDLDIRECT 135.4 11/10/2006 0000      Wt Readings from Last 3 Encounters:  08/14/15 176 lb 8 oz (80.06 kg)  07/10/15 171 lb (77.565 kg)  03/22/15 188 lb 8 oz (85.503 kg)         ASSESSMENT AND PLAN:  1.  Paroxysmal atrial fibrillation: He continues to maintain in sinus rhythm. Continue small dose metoprolol. Most recent creatinine was 1.51. He has frequent feedings of creatinine above 1.5 and thus I elected to decrease the dose of Eliquis to 2.5 mg twice daily.  2. Essential hypertension: The pressure is well controlled on current medications.  3. Lower extremity edema: This is chronic and there is likely a component of chronic venous insufficiency.    Disposition:   FU with me in 6 months  Signed,  Lorine BearsMuhammad Seanpaul Preece, MD  08/14/2015 7:10 PM    [facility]

## 2015-09-04 ENCOUNTER — Other Ambulatory Visit: Payer: Self-pay | Admitting: Internal Medicine

## 2015-09-05 MED ORDER — PRAVASTATIN SODIUM 20 MG PO TABS
20.0000 mg | ORAL_TABLET | Freq: Every day | ORAL | Status: DC
Start: 2015-09-05 — End: 2016-03-18

## 2015-09-05 MED ORDER — BENAZEPRIL-HYDROCHLOROTHIAZIDE 10-12.5 MG PO TABS: 1.0000 | ORAL_TABLET | Freq: Every day | ORAL | Status: DC

## 2015-10-15 ENCOUNTER — Other Ambulatory Visit: Payer: Self-pay | Admitting: Internal Medicine

## 2015-10-16 ENCOUNTER — Telehealth: Payer: Self-pay | Admitting: Internal Medicine

## 2015-10-16 MED ORDER — METFORMIN HCL 1000 MG PO TABS: 1000.0000 mg | ORAL_TABLET | Freq: Every day | ORAL | Status: DC

## 2015-10-16 NOTE — Telephone Encounter (Signed)
Pt request refill of the following:   metFORMIN (GLUCOPHAGE) 1000 MG tablet   Phamacy:  [facility] mail order

## 2015-10-16 NOTE — Telephone Encounter (Signed)
Rx already sent.

## 2015-10-16 NOTE — Addendum Note (Signed)
Addended by: Jimmye NormanPHANOS, Mohab Ashby J on: 10/16/2015 09:24 AM   Modules accepted: Orders

## 2016-01-02 ENCOUNTER — Ambulatory Visit: Payer: Medicare PPO | Admitting: Internal Medicine

## 2016-01-10 ENCOUNTER — Ambulatory Visit: Payer: Medicare PPO | Admitting: Internal Medicine

## 2016-01-23 ENCOUNTER — Ambulatory Visit (INDEPENDENT_AMBULATORY_CARE_PROVIDER_SITE_OTHER): Payer: Medicare PPO | Admitting: Internal Medicine

## 2016-01-23 ENCOUNTER — Encounter: Payer: Self-pay | Admitting: Internal Medicine

## 2016-01-23 VITALS — BP 120/78 | HR 58 | Temp 98.6°F | Resp 18 | Ht 72.0 in | Wt 182.4 lb

## 2016-01-23 DIAGNOSIS — E1151 Type 2 diabetes mellitus with diabetic peripheral angiopathy without gangrene: Secondary | ICD-10-CM | POA: Diagnosis not present

## 2016-01-23 DIAGNOSIS — I4891 Unspecified atrial fibrillation: Secondary | ICD-10-CM | POA: Diagnosis not present

## 2016-01-23 DIAGNOSIS — I10 Essential (primary) hypertension: Secondary | ICD-10-CM | POA: Diagnosis not present

## 2016-01-23 LAB — HEMOGLOBIN A1C: Hgb A1c MFr Bld: 6.8 % — ABNORMAL HIGH (ref 4.6–6.5)

## 2016-01-23 NOTE — Patient Instructions (Signed)
Limit your sodium (Salt) intake  Please check your blood pressure on a regular basis.  If it is consistently greater than 150/90, please make an office appointment.   Please check your hemoglobin A1c every 3-6  months   

## 2016-01-23 NOTE — Progress Notes (Signed)
Pre visit review using our clinic review tool, if applicable. No additional management support is needed unless otherwise documented below in the visit note. 

## 2016-01-23 NOTE — Progress Notes (Signed)
Subjective:    Patient ID: Darryl Leonard., male    DOB: 11/14/1923, 82 y.o.   MRN: [id number]  HPI 82 year old patient who is seen today for his biannual follow-up.  He has type 2 diabetes which has been well-controlled on metformin therapy.  He is accompanied by his wife.  He checks blood sugars almost daily with nice readings He has essential hypertension which has been well-controlled He remains on anticoagulation for atrial fibrillation. Remains on statin therapy.  No cardiopulmonary complaints. His wife is encouraging more activity with walking and also occasional use on a stationary bike He has some chronic lower extremity edema that has been stable.  Past Medical History:  Diagnosis Date  . BENIGN PROSTATIC HYPERTROPHY 11/25/2007  . DIABETES MELLITUS, TYPE II 11/10/2006  . HYPERLIPIDEMIA 11/10/2006  . HYPERTENSION 12/13/2008  . OSTEOARTHRITIS 11/10/2006  . PAF (paroxysmal atrial fibrillation) (HCC)    a. on eliquis  . Pulmonary HTN (HCC)    a. echo 01/2015: EF EF 50-55%, normal wall motion, GR1DD, mild biatrial enlargement, PASP 45 mm Hg  . PVD (peripheral vascular disease) (HCC)   . Rheumatic fever   . Swelling of lower extremity      Social History   Social History  . Marital status: Married    Spouse name: N/A  . Number of children: N/A  . Years of education: N/A   Occupational History  . Not on file.   Social History Main Topics  . Smoking status: Never Smoker  . Smokeless tobacco: Never Used  . Alcohol use Yes  . Drug use: No  . Sexual activity: Not on file   Other Topics Concern  . Not on file   Social History Narrative  . No narrative on file    Past Surgical History:  Procedure Laterality Date  . CATARACT EXTRACTION    . mastoid surgery    . TENDON REPAIR     left foot    Family History  Problem Relation Age of Onset  . Heart disease Mother   . Cancer Father     lung ca    Allergies  Allergen Reactions  . Tramadol     Tingling     Current Outpatient Prescriptions on File Prior to Visit  Medication Sig Dispense Refill  . ACETAMINOPHEN PO Take by mouth as needed. Reported on 06/13/2015    . apixaban (ELIQUIS) 2.5 MG TABS tablet Take 1 tablet (2.5 mg total) by mouth 2 (two) times daily. 60 tablet 6  . benazepril-hydrochlorthiazide (LOTENSIN HCT) 10-12.5 MG tablet Take 1 tablet by mouth daily. 90 tablet 1  . metFORMIN (GLUCOPHAGE) 1000 MG tablet Take 1 tablet (1,000 mg total) by mouth daily with breakfast. 90 tablet 1  . metoprolol (LOPRESSOR) 50 MG tablet Take 0.5 tablets (25 mg total) by mouth 2 (two) times daily. 180 tablet 3  . pravastatin (PRAVACHOL) 20 MG tablet Take 1 tablet (20 mg total) by mouth at bedtime. 90 tablet 1  . TRAVATAN Z 0.004 % SOLN ophthalmic solution Place 1 drop into both eyes at bedtime.      No current facility-administered medications on file prior to visit.     BP 120/78 (BP Location: Left Arm, Patient Position: Sitting, Cuff Size: Normal)   Pulse (!) 58   Temp 98.6 F (37 C) (Oral)   Resp 18   Ht 6' (1.829 m)   Wt 182 lb 6.1 oz (82.7 kg)   SpO2 98%   BMI 24.74 kg/m  Review of Systems  Constitutional: Negative for appetite change, chills, fatigue and fever.  HENT: Negative for congestion, dental problem, ear pain, hearing loss, sore throat, tinnitus, trouble swallowing and voice change.   Eyes: Negative for pain, discharge and visual disturbance.  Respiratory: Negative for cough, chest tightness, wheezing and stridor.   Cardiovascular: Negative for chest pain, palpitations and leg swelling.  Gastrointestinal: Negative for abdominal distention, abdominal pain, blood in stool, constipation, diarrhea, nausea and vomiting.  Genitourinary: Negative for difficulty urinating, discharge, flank pain, genital sores, hematuria and urgency.  Musculoskeletal: Positive for gait problem. Negative for arthralgias, back pain, joint swelling, myalgias and neck stiffness.  Skin: Negative for  rash.  Neurological: Negative for dizziness, syncope, speech difficulty, weakness, numbness and headaches.  Hematological: Negative for adenopathy. Does not bruise/bleed easily.  Psychiatric/Behavioral: Positive for confusion and decreased concentration. Negative for behavioral problems and dysphoric mood. The patient is not nervous/anxious.        Objective:   Physical Exam  Constitutional: He is oriented to person, place, and time. He appears well-developed.  Elderly, frail Requires maximum 1 person assistance to transfer to the examining table Blood pressure well controlled Weight stable 182  HENT:  Head: Normocephalic.  Right Ear: External ear normal.  Left Ear: External ear normal.  Eyes: Conjunctivae and EOM are normal.  Neck: Normal range of motion.  Cardiovascular: Normal rate and normal heart sounds.   Pulmonary/Chest: Breath sounds normal.  Abdominal: Bowel sounds are normal.  Musculoskeletal: Normal range of motion. He exhibits edema. He exhibits no tenderness.  Neurological: He is alert and oriented to person, place, and time.  Psychiatric: He has a normal mood and affect. His behavior is normal.          Assessment & Plan:   Atrial fibrillation.  Continue chronic anticoagulation Essential hypertension, stable Diabetes.  Appears to be well controlled.  Will check hemoglobin A1c Dyslipidemia.  Continue statin therapy  CPX 6 months  Darryl Leonard

## 2016-02-05 ENCOUNTER — Ambulatory Visit: Payer: Medicare PPO | Admitting: Cardiovascular Disease

## 2016-02-05 IMAGING — CT CT HEAD W/O CM
1 of 2 series · 16 of 30 positions shown, 20 images · non-contrast
Comparison: None.

CLINICAL DATA: Headache starting today.

EXAM:
CT HEAD WITHOUT CONTRAST
TECHNIQUE: Contiguous axial images were obtained from the base of the skull
through the vertex without intravenous contrast.

[Series 2: head wo · axial · 0.44mm/px · z∈[+334,+460]mm · 16 of 32 slices shown, 20 images]
[im 2/32  brain]
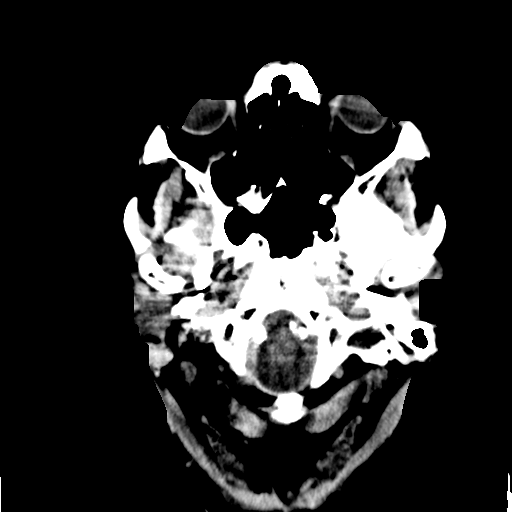
[im 2/32  bone]
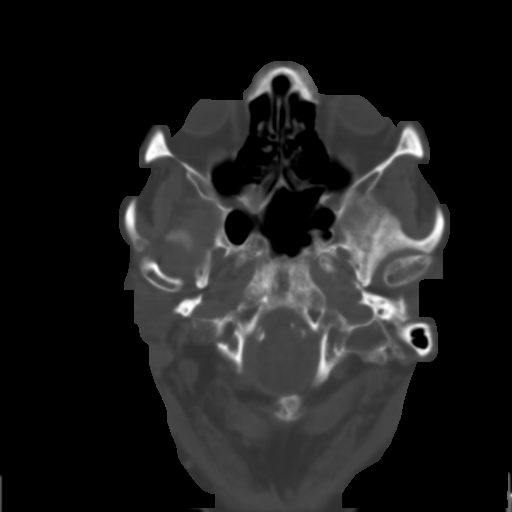
[im 4/32  brain]
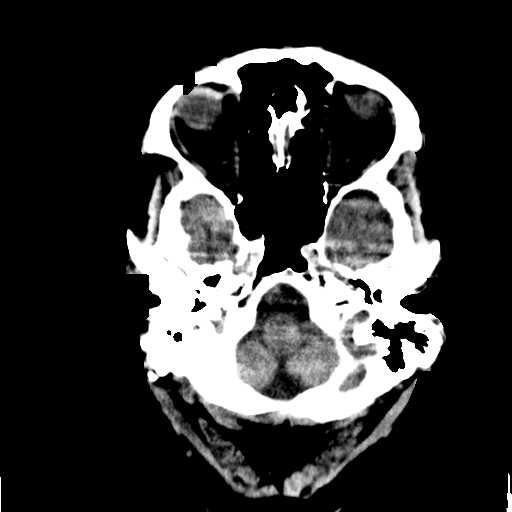
[im 5/32  brain]
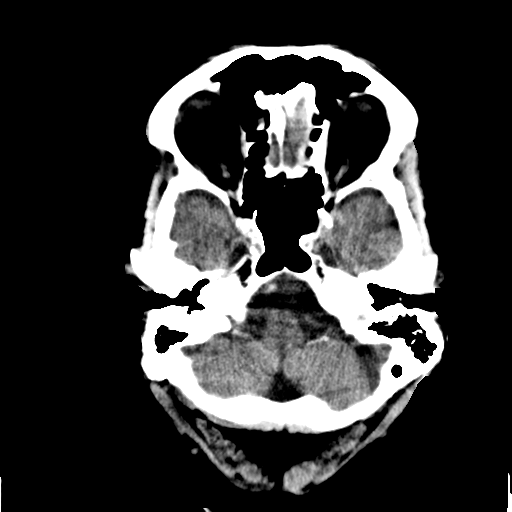
[im 7/32  brain]
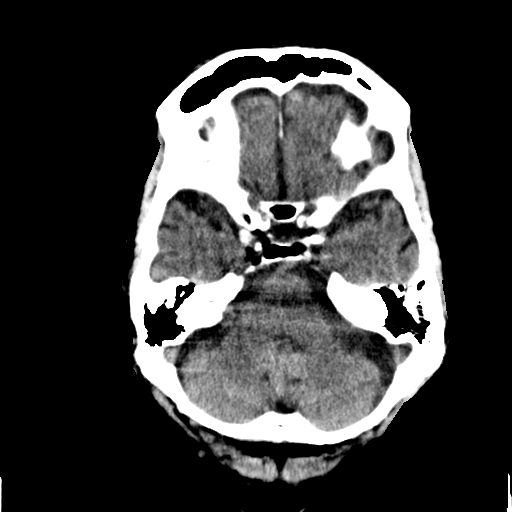
[im 10/32  brain]
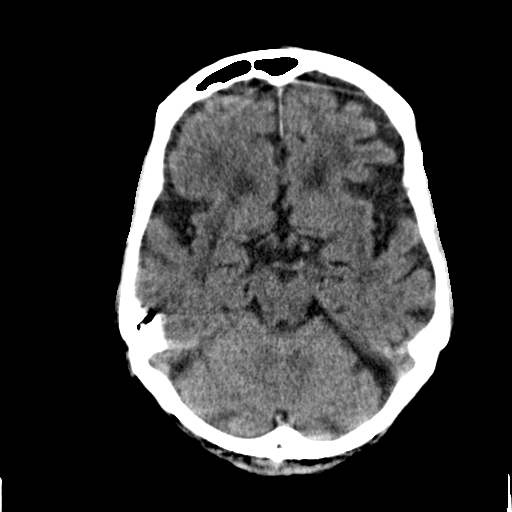
[im 10/32  bone]
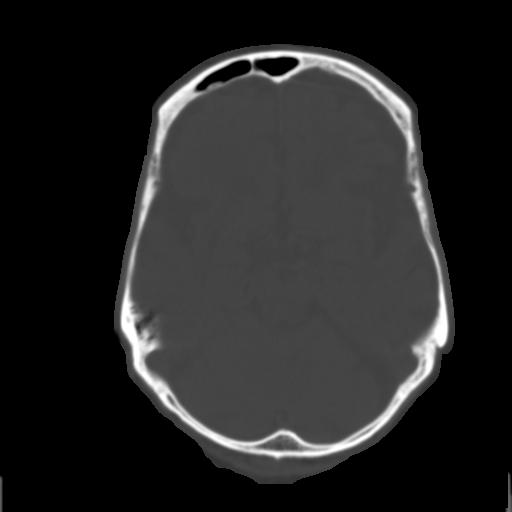
[im 12/32  brain]
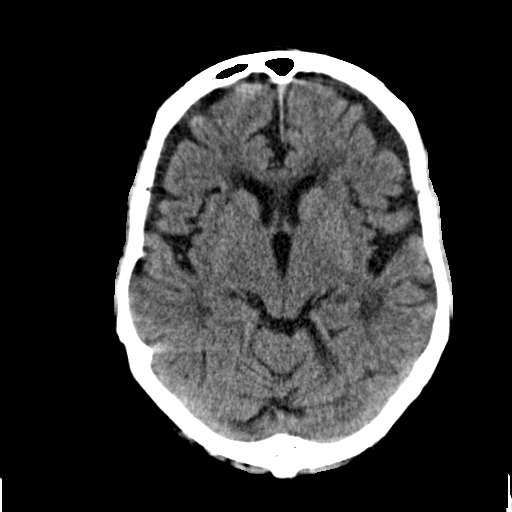
[im 14/32  brain]
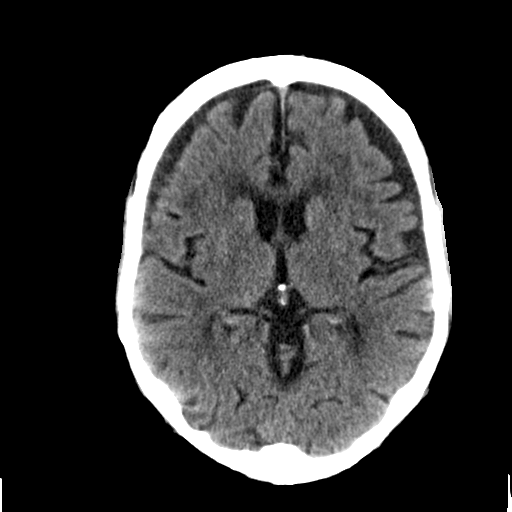
[im 15/32  brain]
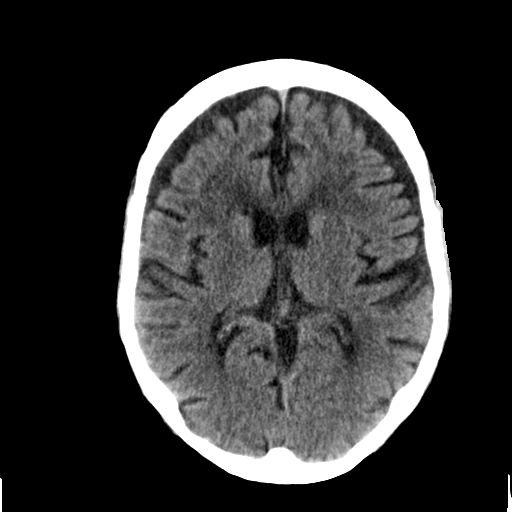
[im 17/32  brain]
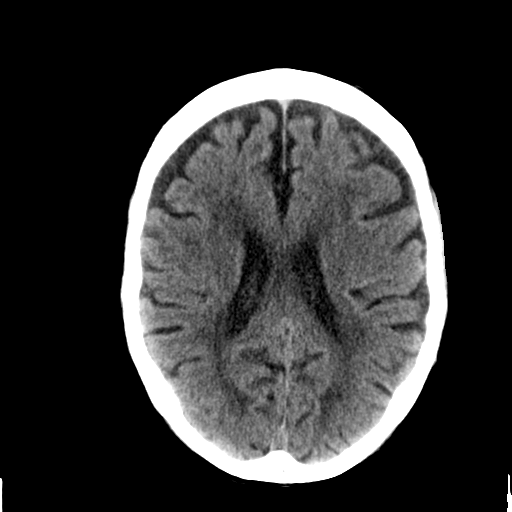
[im 17/32  bone]
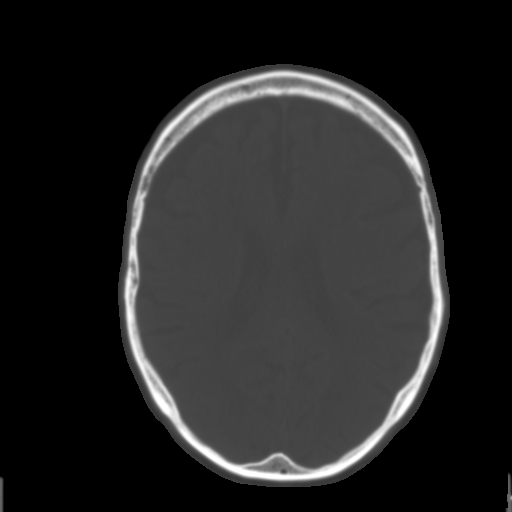
[im 18/32  brain]
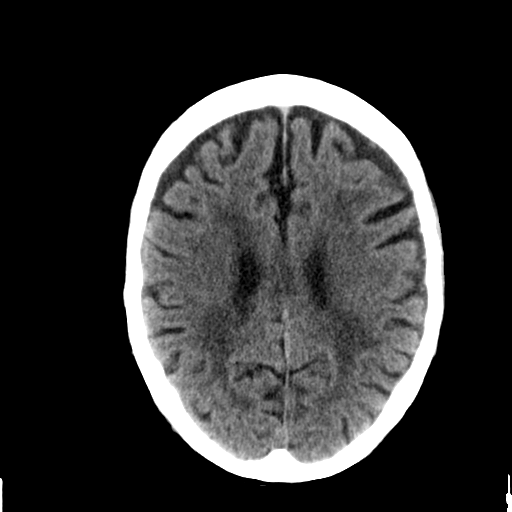
[im 20/32  brain]
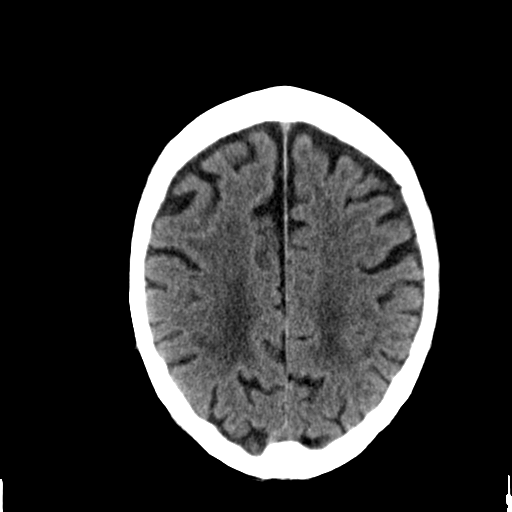
[im 22/32  brain]
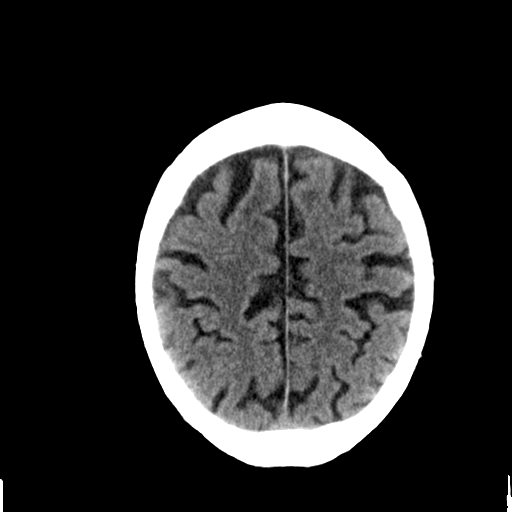
[im 25/32  brain]
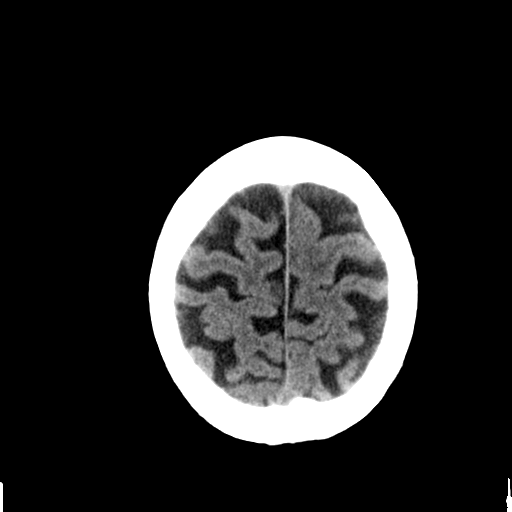
[im 25/32  bone]
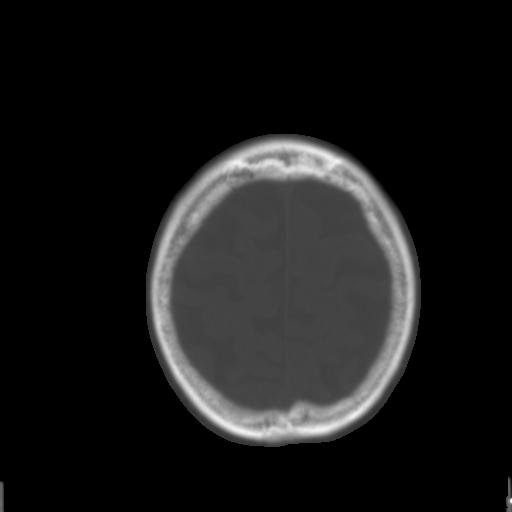
[im 27/32  brain]
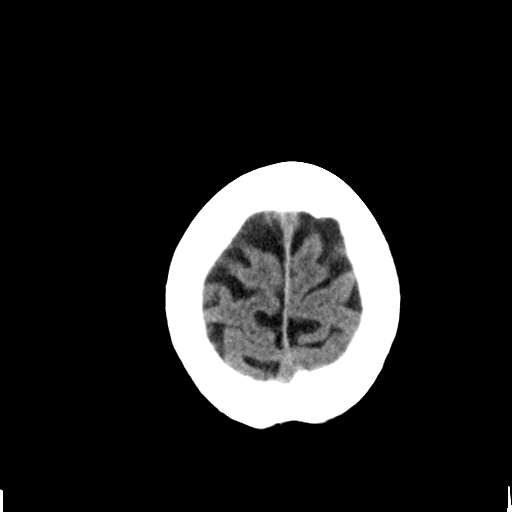
[im 28/32  brain]
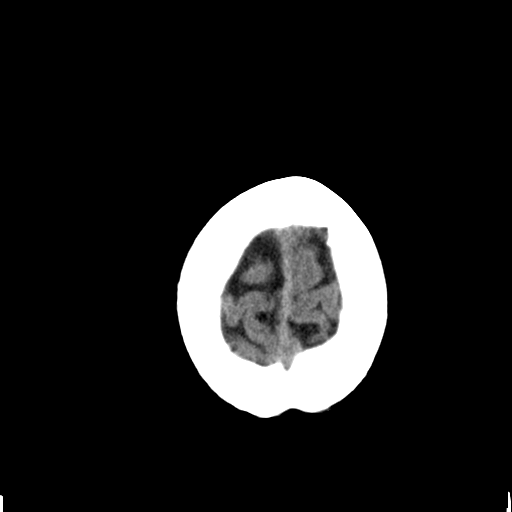
[im 30/32  brain]
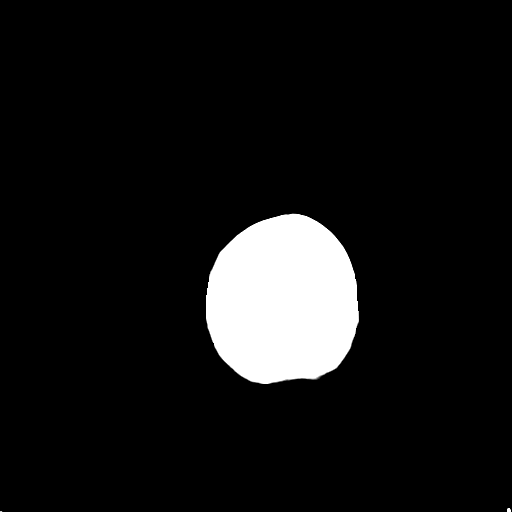

[16 of 30 positions shown; findings below may reference images not displayed]

FINDINGS: There is chronic diffuse atrophy. There is chronic bilateral
periventricular white matter small vessel ischemic change. There is
no midline shift, hydrocephalus, or mass. No acute hemorrhage or
acute transcortical infarct is identified. The bony calvarium is
intact. The visualized sinuses are clear.
IMPRESSION: No focal acute intracranial abnormality identified.

Chronic diffuse atrophy.

## 2016-02-22 ENCOUNTER — Ambulatory Visit (INDEPENDENT_AMBULATORY_CARE_PROVIDER_SITE_OTHER): Payer: Medicare PPO | Admitting: Cardiovascular Disease

## 2016-02-22 ENCOUNTER — Encounter: Payer: Self-pay | Admitting: Cardiovascular Disease

## 2016-02-22 VITALS — BP 150/70 | HR 62 | Ht 72.0 in | Wt 184.5 lb

## 2016-02-22 DIAGNOSIS — I480 Paroxysmal atrial fibrillation: Secondary | ICD-10-CM

## 2016-02-22 DIAGNOSIS — I10 Essential (primary) hypertension: Secondary | ICD-10-CM

## 2016-02-22 NOTE — Patient Instructions (Signed)
Medication Instructions: Continue same medications.   Labwork: None.   Procedures/Testing: None.   Follow-Up: 6 months with Dr. Arida.   Any Additional Special Instructions Will Be Listed Below (If Applicable).     If you need a refill on your cardiac medications before your next appointment, please call your pharmacy.   

## 2016-02-22 NOTE — Progress Notes (Signed)
Cardiology Office Note   Date:  02/22/2016   ID:  Darryl Estimable., DOB April 21, 1924, MRN [id number]  PCP:  [name], MD  Cardiologist:   Lorine Bears, MD   No chief complaint on file.     History of Present Illness: Darryl Friede. is a 82 y.o. male who presents for a follow-up visit regarding paroxysmal atrial fibrillation. He has multiple chronic medical conditions that include type 2 diabetes, hypertension, and hyperlipidemia. He had documented atrial fibrillation with rapid ventricular response in September 2016. Echocardiogram showed an ejection fraction of 50-55% with mild pulmonary hypertension. He was started on metoprolol and has been in sinus rhythm since then. He is on long-term anticoagulation with Eliquis.  I decreased the dose to 2.5 mg twice daily during last visit. He has been doing well and denies any chest pain, shortness of breath or palpitations.  Past Medical History:  Diagnosis Date  . BENIGN PROSTATIC HYPERTROPHY 11/25/2007  . DIABETES MELLITUS, TYPE II 11/10/2006  . HYPERLIPIDEMIA 11/10/2006  . HYPERTENSION 12/13/2008  . OSTEOARTHRITIS 11/10/2006  . PAF (paroxysmal atrial fibrillation) (HCC)    a. on eliquis  . Pulmonary HTN    a. echo 01/2015: EF EF 50-55%, normal wall motion, GR1DD, mild biatrial enlargement, PASP 45 mm Hg  . PVD (peripheral vascular disease) (HCC)   . Rheumatic fever   . Swelling of lower extremity     Past Surgical History:  Procedure Laterality Date  . CATARACT EXTRACTION    . mastoid surgery    . TENDON REPAIR     left foot     Current Outpatient Prescriptions  Medication Sig Dispense Refill  . ACETAMINOPHEN PO Take by mouth as needed. Reported on 06/13/2015    . apixaban (ELIQUIS) 2.5 MG TABS tablet Take 1 tablet (2.5 mg total) by mouth 2 (two) times daily. 60 tablet 6  . benazepril-hydrochlorthiazide (LOTENSIN HCT) 10-12.5 MG tablet Take 1 tablet by mouth daily. 90 tablet 1  . metFORMIN (GLUCOPHAGE)  1000 MG tablet Take 1 tablet (1,000 mg total) by mouth daily with breakfast. 90 tablet 1  . metoprolol (LOPRESSOR) 50 MG tablet Take 0.5 tablets (25 mg total) by mouth 2 (two) times daily. 180 tablet 3  . pravastatin (PRAVACHOL) 20 MG tablet Take 1 tablet (20 mg total) by mouth at bedtime. 90 tablet 1  . TRAVATAN Z 0.004 % SOLN ophthalmic solution Place 1 drop into both eyes at bedtime.      No current facility-administered medications for this visit.     Allergies:   Tramadol    Social History:  The patient  reports that he has never smoked. He has never used smokeless tobacco. He reports that he drinks alcohol. He reports that he does not use drugs.   Family History:  The patient's family history includes Cancer in his father; Heart disease in his mother.    ROS:  Please see the history of present illness.   Otherwise, review of systems are positive for none.   All other systems are reviewed and negative.    PHYSICAL EXAM: VS:  There were no vitals taken for this visit. , BMI There is no height or weight on file to calculate BMI. GEN: Well nourished, well developed, in no acute distress  HEENT: normal  Neck: no JVD, carotid bruits, or masses Cardiac: RRR; no murmurs, rubs, or gallops. Moderate bilateral leg edema mostly nonpitting. Respiratory:  clear to auscultation bilaterally, normal work of breathing GI: soft, nontender, nondistended, +  BS MS: no deformity or atrophy  Skin: warm and dry, no rash Neuro:  Strength and sensation are intact Psych: euthymic mood, full affect   EKG:  EKG is ordered today. The ekg ordered today demonstrates normal sinus rhythm with no significant ST or T wave changes   Recent Labs: 06/13/2015: ALT 3; BUN 22; Creatinine, Ser 1.51; Hemoglobin 11.6; Platelets 292.0; Potassium 4.8; Sodium 139; TSH 3.12    Lipid Panel    Component Value Date/Time   CHOL 173 02/16/2014 1021   TRIG 125.0 02/16/2014 1021   HDL 46.50 02/16/2014 1021   CHOLHDL 4  02/16/2014 1021   VLDL 25.0 02/16/2014 1021   LDLCALC 102 (H) 02/16/2014 1021   LDLDIRECT 135.4 11/10/2006 0000      Wt Readings from Last 3 Encounters:  01/23/16 182 lb 6.1 oz (82.7 kg)  08/14/15 176 lb 8 oz (80.1 kg)  07/10/15 171 lb (77.6 kg)         ASSESSMENT AND PLAN:  1.  Paroxysmal atrial fibrillation: He continues to maintain in sinus rhythm. Continue small dose metoprolol. Most recent creatinine was 1.51. Continue long-term anticoagulation with Eliquis.  2. Essential hypertension: The pressure is well controlled on current medications.  3. Chronic venous insufficiency: Stable leg edema.    Disposition:   FU with me in 6 months  Signed,  Lorine BearsMuhammad Stanlee Roehrig, MD  02/22/2016 1:29 PM    [facility]

## 2016-02-26 LAB — HM DIABETES EYE EXAM

## 2016-02-29 ENCOUNTER — Encounter: Payer: Self-pay | Admitting: Internal Medicine

## 2016-03-18 ENCOUNTER — Encounter: Payer: Self-pay | Admitting: Internal Medicine

## 2016-03-18 MED ORDER — PRAVASTATIN SODIUM 20 MG PO TABS: 20.0000 mg | ORAL_TABLET | Freq: Every day | ORAL | 1 refills | Status: DC

## 2016-03-18 MED ORDER — BENAZEPRIL-HYDROCHLOROTHIAZIDE 10-12.5 MG PO TABS: 1.0000 | ORAL_TABLET | Freq: Every day | ORAL | 1 refills | Status: DC

## 2016-03-25 ENCOUNTER — Other Ambulatory Visit: Payer: Self-pay | Admitting: *Deleted

## 2016-03-25 MED ORDER — PRAVASTATIN SODIUM 20 MG PO TABS: 20.0000 mg | ORAL_TABLET | Freq: Every day | ORAL | 1 refills | Status: DC

## 2016-03-25 MED ORDER — BENAZEPRIL-HYDROCHLOROTHIAZIDE 10-12.5 MG PO TABS: 1.0000 | ORAL_TABLET | Freq: Every day | ORAL | 1 refills | Status: DC

## 2016-04-02 ENCOUNTER — Encounter: Payer: Self-pay | Admitting: Internal Medicine

## 2016-04-02 MED ORDER — METFORMIN HCL 1000 MG PO TABS: 1000.0000 mg | ORAL_TABLET | Freq: Every day | ORAL | 3 refills | Status: DC

## 2016-06-25 ENCOUNTER — Telehealth: Payer: Self-pay | Admitting: Internal Medicine

## 2016-06-25 MED ORDER — METOPROLOL TARTRATE 50 MG PO TABS: 25.0000 mg | ORAL_TABLET | Freq: Two times a day (BID) | ORAL | 3 refills | Status: AC

## 2016-06-25 NOTE — Telephone Encounter (Signed)
Rx refilled.

## 2016-06-25 NOTE — Telephone Encounter (Signed)
Pt request refill   metoprolol (LOPRESSOR) 50 MG tablet   [id number] - [place] ([address]), [place] - [address]  Pt has appt on 08/13/2016

## 2016-07-02 ENCOUNTER — Telehealth: Payer: Self-pay | Admitting: Internal Medicine

## 2016-07-02 ENCOUNTER — Other Ambulatory Visit: Payer: Self-pay

## 2016-07-02 ENCOUNTER — Encounter: Payer: Self-pay | Admitting: Cardiovascular Disease

## 2016-07-02 MED ORDER — APIXABAN 2.5 MG PO TABS: 2.5000 mg | ORAL_TABLET | Freq: Two times a day (BID) | ORAL | 5 refills | Status: DC

## 2016-07-02 NOTE — Telephone Encounter (Signed)
Darryl NixonJanice with [name] called to ask for new rx for   benazepril-hydrochlorthiazide (LOTENSIN HCT) 10-12.5 MG tablet pravastatin (PRAVACHOL) 20 MG tablet  Pt has switched insurance companies and no longer uses [name].  The refills on these meds were lost in the transfer and pt has no more.   90 day refills  [id number] - [place] ([address]), [place] - [address]

## 2016-07-03 ENCOUNTER — Other Ambulatory Visit: Payer: Self-pay | Admitting: Internal Medicine

## 2016-07-03 MED ORDER — PRAVASTATIN SODIUM 20 MG PO TABS: 20.0000 mg | ORAL_TABLET | Freq: Every day | ORAL | 1 refills | Status: AC

## 2016-07-03 MED ORDER — BENAZEPRIL-HYDROCHLOROTHIAZIDE 10-12.5 MG PO TABS: 1.0000 | ORAL_TABLET | Freq: Every day | ORAL | 1 refills | Status: DC

## 2016-07-03 NOTE — Telephone Encounter (Signed)
[facility]

## 2016-07-08 ENCOUNTER — Other Ambulatory Visit: Payer: Self-pay | Admitting: Internal Medicine

## 2016-07-22 ENCOUNTER — Ambulatory Visit: Payer: Medicare PPO | Admitting: Internal Medicine

## 2016-08-13 ENCOUNTER — Ambulatory Visit (INDEPENDENT_AMBULATORY_CARE_PROVIDER_SITE_OTHER): Payer: Medicare HMO | Admitting: Internal Medicine

## 2016-08-13 ENCOUNTER — Encounter: Payer: Self-pay | Admitting: Internal Medicine

## 2016-08-13 VITALS — BP 120/62 | HR 65 | Ht 72.0 in | Wt 176.6 lb

## 2016-08-13 DIAGNOSIS — I480 Paroxysmal atrial fibrillation: Secondary | ICD-10-CM | POA: Diagnosis not present

## 2016-08-13 DIAGNOSIS — E1151 Type 2 diabetes mellitus with diabetic peripheral angiopathy without gangrene: Secondary | ICD-10-CM | POA: Diagnosis not present

## 2016-08-13 DIAGNOSIS — G319 Degenerative disease of nervous system, unspecified: Secondary | ICD-10-CM | POA: Diagnosis not present

## 2016-08-13 DIAGNOSIS — R4189 Other symptoms and signs involving cognitive functions and awareness: Secondary | ICD-10-CM

## 2016-08-13 DIAGNOSIS — I10 Essential (primary) hypertension: Secondary | ICD-10-CM | POA: Diagnosis not present

## 2016-08-13 LAB — CBC WITH DIFFERENTIAL/PLATELET
BASOS PCT: 0.8 % (ref 0.0–3.0)
Basophils Absolute: 0.1 10*3/uL (ref 0.0–0.1)
EOS PCT: 10.6 % — AB (ref 0.0–5.0)
Eosinophils Absolute: 0.9 10*3/uL — ABNORMAL HIGH (ref 0.0–0.7)
HCT: 33.4 % — ABNORMAL LOW (ref 39.0–52.0)
Hemoglobin: 11.1 g/dL — ABNORMAL LOW (ref 13.0–17.0)
LYMPHS ABS: 1.5 10*3/uL (ref 0.7–4.0)
Lymphocytes Relative: 17.4 % (ref 12.0–46.0)
MCHC: 33.4 g/dL (ref 30.0–36.0)
MCV: 89.3 fl (ref 78.0–100.0)
Monocytes Absolute: 0.9 10*3/uL (ref 0.1–1.0)
Monocytes Relative: 10.6 % (ref 3.0–12.0)
NEUTROS ABS: 5.2 10*3/uL (ref 1.4–7.7)
NEUTROS PCT: 60.6 % (ref 43.0–77.0)
Platelets: 399 10*3/uL (ref 150.0–400.0)
RBC: 3.74 Mil/uL — ABNORMAL LOW (ref 4.22–5.81)
RDW: 14.3 % (ref 11.5–15.5)
WBC: 8.5 10*3/uL (ref 4.0–10.5)

## 2016-08-13 LAB — COMPREHENSIVE METABOLIC PANEL
ALBUMIN: 3.7 g/dL (ref 3.5–5.2)
ALT: 2 U/L (ref 0–53)
AST: 10 U/L (ref 0–37)
Alkaline Phosphatase: 75 U/L (ref 39–117)
BUN: 26 mg/dL — AB (ref 6–23)
CHLORIDE: 95 meq/L — AB (ref 96–112)
CO2: 25 meq/L (ref 19–32)
CREATININE: 1.89 mg/dL — AB (ref 0.40–1.50)
Calcium: 8.7 mg/dL (ref 8.4–10.5)
GFR: 35.69 mL/min — ABNORMAL LOW (ref >60.00)
GLUCOSE: 74 mg/dL (ref 70–99)
Potassium: 4.9 mEq/L (ref 3.5–5.1)
SODIUM: 129 meq/L — AB (ref 135–145)
Total Bilirubin: 0.3 mg/dL (ref 0.2–1.2)
Total Protein: 6.3 g/dL (ref 6.0–8.3)

## 2016-08-13 LAB — LIPID PANEL
CHOL/HDL RATIO: 3
CHOLESTEROL: 150 mg/dL (ref 0–200)
HDL: 43.8 mg/dL (ref >39.00)
LDL Cholesterol: 70 mg/dL (ref 0–99)
NONHDL: 106.33
Triglycerides: 184 mg/dL — ABNORMAL HIGH (ref 0.0–149.0)
VLDL: 36.8 mg/dL (ref 0.0–40.0)

## 2016-08-13 LAB — TSH: TSH: 3.87 u[IU]/mL (ref 0.35–4.50)

## 2016-08-13 LAB — HEMOGLOBIN A1C: Hgb A1c MFr Bld: 6.9 % — ABNORMAL HIGH (ref 4.6–6.5)

## 2016-08-13 MED ORDER — METFORMIN HCL 1000 MG PO TABS: 500.0000 mg | ORAL_TABLET | Freq: Every day | ORAL | 3 refills | Status: AC

## 2016-08-13 MED ORDER — BENAZEPRIL HCL 10 MG PO TABS: 10.0000 mg | ORAL_TABLET | Freq: Every day | ORAL | 3 refills | Status: AC

## 2016-08-13 MED ORDER — ESCITALOPRAM OXALATE 5 MG PO TABS: 5.0000 mg | ORAL_TABLET | Freq: Every day | ORAL | 2 refills | Status: DC

## 2016-08-13 NOTE — Progress Notes (Signed)
Pre visit review using our clinic review tool, if applicable. No additional management support is needed unless otherwise documented below in the visit note. 

## 2016-08-13 NOTE — Progress Notes (Signed)
Subjective:    Patient ID: Darryl Leonard., male    DOB: 1923-10-24, 83 y.o.   MRN: [id number]  HPI  83 year old patient who is seen today in follow-up.  He is accompanied by his daughter and his wife. Patient does have a history of cerebral atrophy and concerns include the decrease memory.  He has a chronic wound involving his left medial ankle.  There has been some concern about depression.  He has little activity in daily living and spends much of his day sleeping. The patient also has fecal incontinence issues which interferes with her outside activities. He has essential hypertension, history of paroxysmal atrial fibrillation.  Remains on chronic anticoagulation.  Past Medical History:  Diagnosis Date  . BENIGN PROSTATIC HYPERTROPHY 11/25/2007  . DIABETES MELLITUS, TYPE II 11/10/2006  . HYPERLIPIDEMIA 11/10/2006  . HYPERTENSION 12/13/2008  . OSTEOARTHRITIS 11/10/2006  . PAF (paroxysmal atrial fibrillation) (HCC)    a. on eliquis  . Pulmonary HTN    a. echo 01/2015: EF EF 50-55%, normal wall motion, GR1DD, mild biatrial enlargement, PASP 45 mm Hg  . PVD (peripheral vascular disease) (HCC)   . Rheumatic fever   . Swelling of lower extremity      Social History   Social History  . Marital status: Married    Spouse name: N/A  . Number of children: N/A  . Years of education: N/A   Occupational History  . Not on file.   Social History Main Topics  . Smoking status: Never Smoker  . Smokeless tobacco: Never Used  . Alcohol use Yes  . Drug use: No  . Sexual activity: Not on file   Other Topics Concern  . Not on file   Social History Narrative  . No narrative on file    Past Surgical History:  Procedure Laterality Date  . CATARACT EXTRACTION    . mastoid surgery    . TENDON REPAIR     left foot    Family History  Problem Relation Age of Onset  . Heart disease Mother   . Cancer Father     lung ca    Allergies  Allergen Reactions  . Tramadol     Tingling      Current Outpatient Prescriptions on File Prior to Visit  Medication Sig Dispense Refill  . ACETAMINOPHEN PO Take by mouth as needed. Reported on 06/13/2015    . apixaban (ELIQUIS) 2.5 MG TABS tablet Take 1 tablet (2.5 mg total) by mouth 2 (two) times daily. 60 tablet 5  . metoprolol (LOPRESSOR) 50 MG tablet Take 0.5 tablets (25 mg total) by mouth 2 (two) times daily. 180 tablet 3  . pravastatin (PRAVACHOL) 20 MG tablet Take 1 tablet (20 mg total) by mouth at bedtime. 90 tablet 1  . TRAVATAN Z 0.004 % SOLN ophthalmic solution Place 1 drop into both eyes at bedtime.      No current facility-administered medications on file prior to visit.     BP 120/62 (BP Location: Left Arm, Patient Position: Sitting, Cuff Size: Normal)   Pulse 65   Ht 6' (1.829 m)   Wt 176 lb 9.6 oz (80.1 kg)   SpO2 93%   BMI 23.95 kg/m      Review of Systems  Constitutional: Positive for activity change and fatigue. Negative for appetite change, chills and fever.  HENT: Negative for congestion, dental problem, ear pain, hearing loss, sore throat, tinnitus, trouble swallowing and voice change.   Eyes: Negative for pain, discharge  and visual disturbance.  Respiratory: Negative for cough, chest tightness, wheezing and stridor.   Cardiovascular: Negative for chest pain, palpitations and leg swelling.  Gastrointestinal: Positive for diarrhea. Negative for abdominal distention, abdominal pain, blood in stool, constipation, nausea and vomiting.  Genitourinary: Negative for difficulty urinating, discharge, flank pain, genital sores, hematuria and urgency.  Musculoskeletal: Negative for arthralgias, back pain, gait problem, joint swelling, myalgias and neck stiffness.  Skin: Negative for rash.  Neurological: Positive for weakness. Negative for dizziness, syncope, speech difficulty, numbness and headaches.  Hematological: Negative for adenopathy. Does not bruise/bleed easily.  Psychiatric/Behavioral: Positive for  confusion, decreased concentration and dysphoric mood. Negative for behavioral problems. The patient is not nervous/anxious.        Objective:   Physical Exam  Constitutional: He is oriented to person, place, and time. He appears well-developed.  Mild confusion, but fairly alert and appropriate Blood pressure low normal  HENT:  Head: Normocephalic.  Right Ear: External ear normal.  Left Ear: External ear normal.  Eyes: Conjunctivae and EOM are normal.  Neck: Normal range of motion.  Cardiovascular: Normal rate and normal heart sounds.   Pulmonary/Chest: Breath sounds normal.  Abdominal: Bowel sounds are normal.  Musculoskeletal: Normal range of motion. He exhibits no edema or tenderness.  Neurological: He is alert and oriented to person, place, and time.  Psychiatric: He has a normal mood and affect. His behavior is normal.          Assessment & Plan:   Hypertension.  Will discontinue hydrochlorothiazide.  Blood pressure is in a low-normal range Diabetes mellitus.  Will check a hemoglobin A1c.  In view of his fecal incontinence.  Will decrease metformin further to 500 mg once daily Dyslipidemia.  Continue statin therapy Cerebral atrophy.  We'll check screening lab Possible depression.  We'll place on Lexapro 5 mg daily  Follow-up 3 months  Phoua Hoadley Homero Fellers

## 2016-08-13 NOTE — Patient Instructions (Signed)
Limit your sodium (Salt) intake  Return in 3 months for follow-up   Please check your hemoglobin A1c every 3-6 months\

## 2016-08-20 ENCOUNTER — Encounter: Payer: Self-pay | Admitting: Cardiovascular Disease

## 2016-08-20 ENCOUNTER — Ambulatory Visit (INDEPENDENT_AMBULATORY_CARE_PROVIDER_SITE_OTHER): Payer: Medicare HMO | Admitting: Cardiovascular Disease

## 2016-08-20 VITALS — BP 132/72 | HR 73 | Ht 72.0 in | Wt 173.2 lb

## 2016-08-20 DIAGNOSIS — I10 Essential (primary) hypertension: Secondary | ICD-10-CM

## 2016-08-20 DIAGNOSIS — I480 Paroxysmal atrial fibrillation: Secondary | ICD-10-CM

## 2016-08-20 NOTE — Patient Instructions (Signed)
Medication Instructions: Continue same medications.   Labwork: None.   Procedures/Testing: None.   Follow-Up: 6 months with Dr. Arida.   Any Additional Special Instructions Will Be Listed Below (If Applicable).     If you need a refill on your cardiac medications before your next appointment, please call your pharmacy.   

## 2016-08-20 NOTE — Progress Notes (Signed)
Cardiology Office Note   Date:  08/20/2016   ID:  Cristopher Estimable., DOB 09/27/1923, MRN [id number]  PCP:  [name], MD  Cardiologist:   Lorine Bears, MD   Chief Complaint  Patient presents with  . Other    6 month follow up. Patient c/o SOB with excertion. Meds reviewed verbally with patient.       History of Present Illness: Darryl Leonard. is a 83 y.o. male who presents for a follow-up visit regarding paroxysmal atrial fibrillation. He has multiple chronic medical conditions that include type 2 diabetes, hypertension, and hyperlipidemia. Echocardiogram in 2016 showed an ejection fraction of 50-55% with mild pulmonary hypertension. He is noted to be in atrial fibrillation today but he reports no change in symptoms. He denies any chest pain or worsening dyspnea. No palpitations or dizziness. His functional capacity has been declining gradually. No recent falls. He is tolerating anticoagulation.  Past Medical History:  Diagnosis Date  . BENIGN PROSTATIC HYPERTROPHY 11/25/2007  . DIABETES MELLITUS, TYPE II 11/10/2006  . HYPERLIPIDEMIA 11/10/2006  . HYPERTENSION 12/13/2008  . OSTEOARTHRITIS 11/10/2006  . PAF (paroxysmal atrial fibrillation) (HCC)    a. on eliquis  . Pulmonary HTN    a. echo 01/2015: EF EF 50-55%, normal wall motion, GR1DD, mild biatrial enlargement, PASP 45 mm Hg  . PVD (peripheral vascular disease) (HCC)   . Rheumatic fever   . Swelling of lower extremity     Past Surgical History:  Procedure Laterality Date  . CATARACT EXTRACTION    . mastoid surgery    . TENDON REPAIR     left foot     Current Outpatient Prescriptions  Medication Sig Dispense Refill  . ACETAMINOPHEN PO Take by mouth as needed. Reported on 06/13/2015    . apixaban (ELIQUIS) 2.5 MG TABS tablet Take 1 tablet (2.5 mg total) by mouth 2 (two) times daily. 60 tablet 5  . benazepril (LOTENSIN) 10 MG tablet Take 1 tablet (10 mg total) by mouth daily. 90 tablet 3  .  escitalopram (LEXAPRO) 5 MG tablet Take 1 tablet (5 mg total) by mouth daily. 90 tablet 2  . metFORMIN (GLUCOPHAGE) 1000 MG tablet Take 0.5 tablets (500 mg total) by mouth daily with breakfast. 90 tablet 3  . metoprolol (LOPRESSOR) 50 MG tablet Take 0.5 tablets (25 mg total) by mouth 2 (two) times daily. 180 tablet 3  . pravastatin (PRAVACHOL) 20 MG tablet Take 1 tablet (20 mg total) by mouth at bedtime. 90 tablet 1  . TRAVATAN Z 0.004 % SOLN ophthalmic solution Place 1 drop into both eyes at bedtime.      No current facility-administered medications for this visit.     Allergies:   Tramadol    Social History:  The patient  reports that he has never smoked. He has never used smokeless tobacco. He reports that he drinks alcohol. He reports that he does not use drugs.   Family History:  The patient's family history includes Cancer in his father; Heart disease in his mother.    ROS:  Please see the history of present illness.   Otherwise, review of systems are positive for none.   All other systems are reviewed and negative.    PHYSICAL EXAM: VS:  BP 132/72 (BP Location: Left Arm, Patient Position: Sitting, Cuff Size: Normal)   Pulse 73   Ht 6' (1.829 m)   Wt 173 lb 4 oz (78.6 kg)   BMI 23.50 kg/m  , BMI Body  mass index is 23.5 kg/m. GEN: Well nourished, well developed, in no acute distress  HEENT: normal  Neck: no JVD, carotid bruits, or masses Cardiac: Irregularly irregular; no murmurs, rubs, or gallops. Moderate bilateral leg edema mostly nonpitting. Respiratory:  clear to auscultation bilaterally, normal work of breathing GI: soft, nontender, nondistended, + BS MS: no deformity or atrophy  Skin: warm and dry, no rash Neuro:  Strength and sensation are intact Psych: euthymic mood, full affect   EKG:  EKG is ordered today. The ekg ordered today demonstrates atrial fibrillation with nonspecific ST changes.   Recent Labs: 08/13/2016: ALT 2; BUN 26; Creatinine, Ser 1.89;  Hemoglobin 11.1; Platelets 399.0; Potassium 4.9; Sodium 129; TSH 3.87    Lipid Panel    Component Value Date/Time   CHOL 150 08/13/2016 1106   TRIG 184.0 (H) 08/13/2016 1106   HDL 43.80 08/13/2016 1106   CHOLHDL 3 08/13/2016 1106   VLDL 36.8 08/13/2016 1106   LDLCALC 70 08/13/2016 1106   LDLDIRECT 135.4 11/10/2006 0000      Wt Readings from Last 3 Encounters:  08/20/16 173 lb 4 oz (78.6 kg)  08/13/16 176 lb 9.6 oz (80.1 kg)  02/22/16 184 lb 8 oz (83.7 kg)         ASSESSMENT AND PLAN:  1.  Paroxysmal atrial fibrillation: He is noted to be in atrial fibrillation today with ventricular rate is controlled and he does not appear to be symptomatic. Continue current dose of metoprolol. He does have chronic kidney disease with creatinine about 1.5. Continue small dose Eliquis for anticoagulation.  2. Essential hypertension: The pressure is well controlled on current medications.  3. Chronic venous insufficiency: Stable leg edema.    Disposition:   FU with me in 6 months  Signed,  Lorine Bears, MD  08/20/2016 2:11 PM    [facility]

## 2016-08-26 ENCOUNTER — Other Ambulatory Visit: Payer: Self-pay | Admitting: Cardiovascular Disease

## 2016-08-26 ENCOUNTER — Telehealth: Payer: Self-pay | Admitting: Cardiovascular Disease

## 2016-08-26 MED ORDER — APIXABAN 2.5 MG PO TABS: 2.5000 mg | ORAL_TABLET | Freq: Two times a day (BID) | ORAL | 3 refills | Status: DC

## 2016-08-26 NOTE — Telephone Encounter (Signed)
Refill sent.

## 2016-08-26 NOTE — Telephone Encounter (Signed)
Patient calling the office for samples of medication:   1.  What medication and dosage are you requesting samples for? Eliquis 2.5  2.  Are you currently out of this medication?  Has a week left of pills

## 2016-09-10 DIAGNOSIS — H353132 Nonexudative age-related macular degeneration, bilateral, intermediate dry stage: Secondary | ICD-10-CM | POA: Diagnosis not present

## 2016-09-10 DIAGNOSIS — H02052 Trichiasis without entropian right lower eyelid: Secondary | ICD-10-CM | POA: Diagnosis not present

## 2016-09-13 ENCOUNTER — Ambulatory Visit (INDEPENDENT_AMBULATORY_CARE_PROVIDER_SITE_OTHER): Payer: Medicare HMO | Admitting: Internal Medicine

## 2016-09-13 ENCOUNTER — Encounter: Payer: Self-pay | Admitting: Internal Medicine

## 2016-09-13 VITALS — BP 138/62 | HR 91 | Temp 97.8°F | Ht 72.0 in | Wt 174.4 lb

## 2016-09-13 DIAGNOSIS — I10 Essential (primary) hypertension: Secondary | ICD-10-CM | POA: Diagnosis not present

## 2016-09-13 DIAGNOSIS — E1151 Type 2 diabetes mellitus with diabetic peripheral angiopathy without gangrene: Secondary | ICD-10-CM

## 2016-09-13 LAB — BASIC METABOLIC PANEL
BUN: 21 mg/dL (ref 6–23)
CALCIUM: 8.7 mg/dL (ref 8.4–10.5)
CO2: 25 meq/L (ref 19–32)
CREATININE: 1.53 mg/dL — AB (ref 0.40–1.50)
Chloride: 102 mEq/L (ref 96–112)
GFR: 45.54 mL/min — AB (ref >60.00)
GLUCOSE: 91 mg/dL (ref 70–99)
Potassium: 4.5 mEq/L (ref 3.5–5.1)
Sodium: 136 mEq/L (ref 135–145)

## 2016-09-13 NOTE — Patient Instructions (Addendum)
WE NOW OFFER   [facility] FAST TRACK!!!  SAME DAY Appointments for ACUTE CARE  Such as: Sprains, Injuries, cuts, abrasions, rashes, muscle pain, joint pain, back pain Colds, flu, sore throats, headache, allergies, cough, fever  Ear pain, sinus and eye infections Abdominal pain, nausea, vomiting, diarrhea, upset stomach Animal/insect bites  3 Easy Ways to Schedule: Walk-In Scheduling Call in scheduling Mychart Sign-up: [email or web address]    Limit your sodium (Salt) intake  Return in 3 months for follow-up

## 2016-09-13 NOTE — Progress Notes (Signed)
Subjective:    Patient ID: Darryl EstimableWilliam Klahr Jr., male    DOB: 10/14/1923, 83 y.o.   MRN: [id number]  HPI  83 year old patientWho has essential hypertension, atrial fibrillation, and diabetes.  He also has a history of cognitive impairment and cerebral atrophy. He was seen earlier with general decline in his condition, with suspicion for depression.  He was eating poorly and also having some loose stools.  Metformin therapy was decreased and hydrochlorothiazide was discontinued.  subsequent  Lab revealed evidence of volume depletion and hyponatremia. Today the patient feels well and family believes the patient is back to baseline  Past Medical History:  Diagnosis Date  . BENIGN PROSTATIC HYPERTROPHY 11/25/2007  . DIABETES MELLITUS, TYPE II 11/10/2006  . HYPERLIPIDEMIA 11/10/2006  . HYPERTENSION 12/13/2008  . OSTEOARTHRITIS 11/10/2006  . PAF (paroxysmal atrial fibrillation) (HCC)    a. on eliquis  . Pulmonary HTN (HCC)    a. echo 01/2015: EF EF 50-55%, normal wall motion, GR1DD, mild biatrial enlargement, PASP 45 mm Hg  . PVD (peripheral vascular disease) (HCC)   . Rheumatic fever   . Swelling of lower extremity      Social History   Social History  . Marital status: Married    Spouse name: N/A  . Number of children: N/A  . Years of education: N/A   Occupational History  . Not on file.   Social History Main Topics  . Smoking status: Never Smoker  . Smokeless tobacco: Never Used  . Alcohol use Yes  . Drug use: No  . Sexual activity: Not on file   Other Topics Concern  . Not on file   Social History Narrative  . No narrative on file    Past Surgical History:  Procedure Laterality Date  . CATARACT EXTRACTION    . mastoid surgery    . TENDON REPAIR     left foot    Family History  Problem Relation Age of Onset  . Heart disease Mother   . Cancer Father     lung ca    Allergies  Allergen Reactions  . Tramadol     Tingling    Current Outpatient Prescriptions on  File Prior to Visit  Medication Sig Dispense Refill  . ACETAMINOPHEN PO Take by mouth as needed. Reported on 06/13/2015    . apixaban (ELIQUIS) 2.5 MG TABS tablet Take 1 tablet (2.5 mg total) by mouth 2 (two) times daily. 60 tablet 3  . benazepril (LOTENSIN) 10 MG tablet Take 1 tablet (10 mg total) by mouth daily. 90 tablet 3  . escitalopram (LEXAPRO) 5 MG tablet Take 1 tablet (5 mg total) by mouth daily. 90 tablet 2  . metFORMIN (GLUCOPHAGE) 1000 MG tablet Take 0.5 tablets (500 mg total) by mouth daily with breakfast. 90 tablet 3  . metoprolol (LOPRESSOR) 50 MG tablet Take 0.5 tablets (25 mg total) by mouth 2 (two) times daily. 180 tablet 3  . pravastatin (PRAVACHOL) 20 MG tablet Take 1 tablet (20 mg total) by mouth at bedtime. 90 tablet 1  . TRAVATAN Z 0.004 % SOLN ophthalmic solution Place 1 drop into both eyes at bedtime.      No current facility-administered medications on file prior to visit.     BP 138/62 (BP Location: Left Arm, Patient Position: Sitting, Cuff Size: Normal)   Pulse 91   Temp 97.8 F (36.6 C) (Oral)   Ht 6' (1.829 m)   Wt 174 lb 6.4 oz (79.1 kg)   SpO2 96%  BMI 23.65 kg/m      Review of Systems  Constitutional: Positive for activity change, appetite change and fatigue. Negative for chills and fever.  HENT: Negative for congestion, dental problem, ear pain, hearing loss, sore throat, tinnitus, trouble swallowing and voice change.   Eyes: Negative for pain, discharge and visual disturbance.  Respiratory: Negative for cough, chest tightness, wheezing and stridor.   Cardiovascular: Negative for chest pain, palpitations and leg swelling.  Gastrointestinal: Negative for abdominal distention, abdominal pain, blood in stool, constipation, diarrhea, nausea and vomiting.  Genitourinary: Negative for difficulty urinating, discharge, flank pain, genital sores, hematuria and urgency.  Musculoskeletal: Negative for arthralgias, back pain, gait problem, joint swelling,  myalgias and neck stiffness.  Skin: Negative for rash.  Neurological: Positive for weakness. Negative for dizziness, syncope, speech difficulty, numbness and headaches.  Hematological: Negative for adenopathy. Does not bruise/bleed easily.  Psychiatric/Behavioral: Positive for confusion. Negative for behavioral problems and dysphoric mood. The patient is not nervous/anxious.        Objective:   Physical Exam  Constitutional: He is oriented to person, place, and time. He appears well-developed and well-nourished. No distress.  Blood pressure 130/66  HENT:  Head: Normocephalic.  Right Ear: External ear normal.  Left Ear: External ear normal.  Moist mucosal membranes  Eyes: Conjunctivae and EOM are normal.  Neck: Normal range of motion.  Cardiovascular: Normal rate and normal heart sounds.   Irregular with controlled ventricular response  Pulmonary/Chest: Breath sounds normal.  Abdominal: Bowel sounds are normal.  Musculoskeletal: Normal range of motion. He exhibits no edema or tenderness.  Neurological: He is alert and oriented to person, place, and time.  Psychiatric: He has a normal mood and affect. His behavior is normal.          Assessment & Plan:   Hyponatremia.  Hopefully this has improved since discontinuation of diuretic therapy.  We'll recheck Hypertension, stable Volume depletion.  Diuretic therapy discontinued  Follow-up 3 months Review electrolytes  [name]

## 2016-09-13 NOTE — Progress Notes (Signed)
Pre visit review using our clinic review tool, if applicable. No additional management support is needed unless otherwise documented below in the visit note. 

## 2016-10-29 ENCOUNTER — Encounter: Payer: Self-pay | Admitting: Internal Medicine

## 2016-11-04 ENCOUNTER — Other Ambulatory Visit: Payer: Self-pay | Admitting: Internal Medicine

## 2016-11-04 DIAGNOSIS — F0150 Vascular dementia without behavioral disturbance: Secondary | ICD-10-CM

## 2016-11-04 DIAGNOSIS — Z515 Encounter for palliative care: Secondary | ICD-10-CM

## 2016-11-06 ENCOUNTER — Telehealth: Payer: Self-pay

## 2016-11-06 NOTE — Telephone Encounter (Signed)
Called -[facility], awaiting referral form to fill out.

## 2016-11-06 NOTE — Telephone Encounter (Signed)
[facility] a [facility] from [facility] that they can not take on the patient due to staffing and his location. He needs to be referred to [facility].

## 2016-11-07 NOTE — Telephone Encounter (Signed)
Hospice called back to follow-up on referral.  Advised it was received and awaiting Dr. Charm RingsK's signature.

## 2016-11-07 NOTE — Telephone Encounter (Signed)
Form received.  Placed in Dr. Charm RingsK's folder for signature.

## 2016-11-08 NOTE — Telephone Encounter (Signed)
Never received back from Dr. Estanislado PandyK.  Paty does not have.  Will assume paper has been faxed and sent to scan.  No longer in Dr. Charm RingsK's basket.

## 2016-11-11 NOTE — Telephone Encounter (Signed)
Received paper work back from Dr. Kirtland BouchardK.  Faxed and received confirmation that the fax was successful.

## 2016-11-12 ENCOUNTER — Telehealth: Payer: Self-pay | Admitting: Internal Medicine

## 2016-11-12 NOTE — Telephone Encounter (Signed)
Stanton KidneyDebra is call back she was able to get into with pt wife who decline hospice service at this time for her husband

## 2016-11-12 NOTE — Telephone Encounter (Signed)
Warnell ForesterFYI Debra is calling from [facility] and trying to get in touch with patient. Stanton KidneyDebra has not be able to reach patient and would like dr Kirtland Bouchardk to know

## 2016-11-12 NOTE — Telephone Encounter (Signed)
Please note

## 2016-11-14 NOTE — Telephone Encounter (Signed)
Amy from [facility] is calling because the patient can not use their services because he is not in their area.  FYI

## 2016-12-23 ENCOUNTER — Ambulatory Visit: Payer: Medicare HMO | Admitting: Internal Medicine

## 2016-12-24 ENCOUNTER — Ambulatory Visit (INDEPENDENT_AMBULATORY_CARE_PROVIDER_SITE_OTHER): Payer: Medicare HMO | Admitting: Internal Medicine

## 2016-12-24 ENCOUNTER — Encounter: Payer: Self-pay | Admitting: Internal Medicine

## 2016-12-24 VITALS — BP 130/68 | HR 77 | Temp 97.9°F | Ht 72.0 in | Wt 169.4 lb

## 2016-12-24 DIAGNOSIS — E1151 Type 2 diabetes mellitus with diabetic peripheral angiopathy without gangrene: Secondary | ICD-10-CM

## 2016-12-24 DIAGNOSIS — N400 Enlarged prostate without lower urinary tract symptoms: Secondary | ICD-10-CM | POA: Diagnosis not present

## 2016-12-24 DIAGNOSIS — I10 Essential (primary) hypertension: Secondary | ICD-10-CM | POA: Diagnosis not present

## 2016-12-24 DIAGNOSIS — G319 Degenerative disease of nervous system, unspecified: Secondary | ICD-10-CM

## 2016-12-24 DIAGNOSIS — I4891 Unspecified atrial fibrillation: Secondary | ICD-10-CM

## 2016-12-24 DIAGNOSIS — R4189 Other symptoms and signs involving cognitive functions and awareness: Secondary | ICD-10-CM | POA: Diagnosis not present

## 2016-12-24 LAB — POCT GLYCOSYLATED HEMOGLOBIN (HGB A1C): Hemoglobin A1C: 6

## 2016-12-24 MED ORDER — DONEPEZIL HCL 5 MG PO TABS: 5.0000 mg | ORAL_TABLET | Freq: Every day | ORAL | 3 refills | Status: AC

## 2016-12-24 MED ORDER — TAMSULOSIN HCL 0.4 MG PO CAPS: 0.4000 mg | ORAL_CAPSULE | Freq: Every day | ORAL | 3 refills | Status: AC

## 2016-12-24 NOTE — Progress Notes (Signed)
Subjective:    Patient ID: Darryl EstimableWilliam Tang Jr., male    DOB: 08/16/1923, 83 y.o.   MRN: [id number]  HPI  83 year old patient who is seen today in follow-up. He has history of type 2 diabetes which has been controlled on metformin therapy only.  He has essential hypertension and chronic atrial fibrillation.  Remains on chronic anticoagulation. He has a history of depression and seemed to improve on Lexapro for a period of time.  He has been off for several weeks and continues to do well .Apparently he had a major decline in June and hospice was involved in his care.  More recently, he has improved and is back to baseline. Complaints include worsening nocturia and urinary frequency as well as worsening short-term memory.  He does have a history of diffuse cerebral atrophy and cognitive impairment  Lab Results  Component Value Date   HGBA1C 6.9 (H) 08/13/2016   Past Medical History:  Diagnosis Date  . BENIGN PROSTATIC HYPERTROPHY 11/25/2007  . DIABETES MELLITUS, TYPE II 11/10/2006  . HYPERLIPIDEMIA 11/10/2006  . HYPERTENSION 12/13/2008  . OSTEOARTHRITIS 11/10/2006  . PAF (paroxysmal atrial fibrillation) (HCC)    a. on eliquis  . Pulmonary HTN (HCC)    a. echo 01/2015: EF EF 50-55%, normal wall motion, GR1DD, mild biatrial enlargement, PASP 45 mm Hg  . PVD (peripheral vascular disease) (HCC)   . Rheumatic fever   . Swelling of lower extremity      Social History   Social History  . Marital status: Married    Spouse name: N/A  . Number of children: N/A  . Years of education: N/A   Occupational History  . Not on file.   Social History Main Topics  . Smoking status: Never Smoker  . Smokeless tobacco: Never Used  . Alcohol use Yes  . Drug use: No  . Sexual activity: Not on file   Other Topics Concern  . Not on file   Social History Narrative  . No narrative on file    Past Surgical History:  Procedure Laterality Date  . CATARACT EXTRACTION    . mastoid surgery    .  TENDON REPAIR     left foot    Family History  Problem Relation Age of Onset  . Heart disease Mother   . Cancer Father        lung ca    Allergies  Allergen Reactions  . Tramadol     Tingling    Current Outpatient Prescriptions on File Prior to Visit  Medication Sig Dispense Refill  . ACETAMINOPHEN PO Take by mouth as needed. Reported on 06/13/2015    . apixaban (ELIQUIS) 2.5 MG TABS tablet Take 1 tablet (2.5 mg total) by mouth 2 (two) times daily. 60 tablet 3  . benazepril (LOTENSIN) 10 MG tablet Take 1 tablet (10 mg total) by mouth daily. 90 tablet 3  . metFORMIN (GLUCOPHAGE) 1000 MG tablet Take 0.5 tablets (500 mg total) by mouth daily with breakfast. 90 tablet 3  . metoprolol (LOPRESSOR) 50 MG tablet Take 0.5 tablets (25 mg total) by mouth 2 (two) times daily. 180 tablet 3  . pravastatin (PRAVACHOL) 20 MG tablet Take 1 tablet (20 mg total) by mouth at bedtime. 90 tablet 1  . TRAVATAN Z 0.004 % SOLN ophthalmic solution Place 1 drop into both eyes at bedtime.      No current facility-administered medications on file prior to visit.     BP 130/68   Pulse 77  Temp 97.9 F (36.6 C) (Oral)   Ht 6' (1.829 m)   Wt 169 lb 6.4 oz (76.8 kg)   SpO2 95%   BMI 22.97 kg/m     Review of Systems  Constitutional: Positive for fatigue. Negative for appetite change, chills and fever.  HENT: Negative for congestion, dental problem, ear pain, hearing loss, sore throat, tinnitus, trouble swallowing and voice change.   Eyes: Negative for pain, discharge and visual disturbance.  Respiratory: Negative for cough, chest tightness, wheezing and stridor.   Cardiovascular: Positive for leg swelling. Negative for chest pain and palpitations.  Gastrointestinal: Negative for abdominal distention, abdominal pain, blood in stool, constipation, diarrhea, nausea and vomiting.  Genitourinary: Negative for difficulty urinating, discharge, flank pain, genital sores, hematuria and urgency.    Musculoskeletal: Negative for arthralgias, back pain, gait problem, joint swelling, myalgias and neck stiffness.  Skin: Negative for rash.  Neurological: Negative for dizziness, syncope, speech difficulty, weakness, numbness and headaches.  Hematological: Negative for adenopathy. Does not bruise/bleed easily.  Psychiatric/Behavioral: Positive for decreased concentration. Negative for behavioral problems and dysphoric mood. The patient is not nervous/anxious.        Objective:   Physical Exam  Constitutional: He is oriented to person, place, and time. He appears well-developed.  HENT:  Head: Normocephalic.  Right Ear: External ear normal.  Left Ear: External ear normal.  Eyes: Conjunctivae and EOM are normal.  Neck: Normal range of motion.  Cardiovascular: Normal rate and normal heart sounds.   Pulse 67 Irregular  Pulmonary/Chest: Breath sounds normal.  O2 saturation 95% Pulse 67  Abdominal: Bowel sounds are normal.  Musculoskeletal: Normal range of motion. He exhibits edema. He exhibits no tenderness.  Neurological: He is alert and oriented to person, place, and time.  Psychiatric: He has a normal mood and affect. His behavior is normal.  Cognitive impairment          Assessment & Plan:   Senile dementia.  Will place on Aricept 5 Symptomatic BPH.  Will give a trial of Flomax 0.4 Diabetes mellitus.  Continue metformin therapy.  We'll review a hemoglobin A1c Essential hypertension, stable Chronic atrial fibrillation.  Continue anticoagulation History depression, stable  Follow-up 3 months  KWIATKOWSKI,PETER Homero Fellers

## 2016-12-24 NOTE — Patient Instructions (Signed)
Limit your sodium (Salt) intake   Please check your hemoglobin A1c every 3-6 months   

## 2017-01-01 ENCOUNTER — Other Ambulatory Visit: Payer: Self-pay

## 2017-01-01 MED ORDER — APIXABAN 2.5 MG PO TABS: 2.5000 mg | ORAL_TABLET | Freq: Two times a day (BID) | ORAL | 3 refills | Status: AC

## 2017-01-27 ENCOUNTER — Telehealth: Payer: Self-pay

## 2017-01-27 NOTE — Telephone Encounter (Signed)
[facility]   [facility] Call Center Patient Name: Darryl Leonard Gender: Male DOB: March 16, 1924 Age: 83 Y 8 M 5 D Return Phone Number: [phone] (Primary), [phone] (Secondary) City/State/Zip: [place] [address] Client [facility] - Client Client Site [facility] - Night Physician Derryl Harbor - MD Who Is Calling Patient / Member / Family / Caregiver Call Type Triage / Clinical Caller Name Cameron Proud Relationship To Patient Daughter Return Phone Number [phone] (Primary) Chief Complaint FAINTING or PASSING OUT Reason for Call Symptomatic / Request for Health Information Initial Comment Caller states her father passed out for a few minutes. His blood pressure was real low. BP 114/57. Nurse Assessment Nurse: Harlon Flor, RN, Darl Pikes Date/Time (Eastern Time): 01/24/2017 6:20:27 PM Confirm and document reason for call. If symptomatic, describe symptoms. ---Caller states her father passed out for a few minutes. His blood pressure was real low. BP 114/57. He was eating and fell over onto the table drooling . came around . Does the PT have any chronic conditions? (i.e. diabetes, asthma, etc.) ---Yes List chronic conditions. ---HTN NIDDM Afib Eliquis Disp. Time Lamount Cohen Time) Disposition Final User 01/24/2017 6:27:50 PM Call EMS 911 Now Yes Harlon Flor, RN, Darl Pikes Referrals GO TO FACILITY REFUSED GO TO FACILITY REFUSED

## 2017-01-28 NOTE — Telephone Encounter (Signed)
Spoke with pt's wife and daughter. They state that pt had gotten out of bed and walked to the kitchen to sit at the bar and eat some watermelon. Once he sat down he passed out. He did not hit his head or have any other injuries. He did regain consciousness within a few minutes. He did not have any other s/s such as visual or speech disturbance. He does have a history of being "unsteady" when rising too fast and does have to walk slowly. They attribute this episode to him getting out of bed and walking to the kitchen too quickly. They do not see the need for an OV at this time. I advised them that if this happens again in the near future they do need to schedule an OV so that he can be assessed and his mediations reviewed. They voiced understanding to all recommendations. Nothing further needed at this time.

## 2017-01-30 ENCOUNTER — Encounter: Payer: Self-pay | Admitting: Internal Medicine

## 2017-02-17 ENCOUNTER — Encounter: Payer: Self-pay | Admitting: Cardiovascular Disease

## 2017-02-17 ENCOUNTER — Ambulatory Visit (INDEPENDENT_AMBULATORY_CARE_PROVIDER_SITE_OTHER): Payer: Medicare HMO | Admitting: Cardiovascular Disease

## 2017-02-17 VITALS — BP 120/64 | HR 91 | Ht 72.0 in | Wt 164.8 lb

## 2017-02-17 DIAGNOSIS — I482 Chronic atrial fibrillation, unspecified: Secondary | ICD-10-CM

## 2017-02-17 DIAGNOSIS — I10 Essential (primary) hypertension: Secondary | ICD-10-CM

## 2017-02-17 NOTE — Progress Notes (Signed)
Cardiology Office Note   Date:  02/17/2017   ID:  Darryl Estimable., DOB 1923-11-22, MRN [id number]  PCP:  Gordy Savers, MD  Cardiologist:   Lorine Bears, MD   Chief Complaint  Patient presents with  . other    6 month follow up. Patient c/o SOB. Meds reviewed verbally with patient.       History of Present Illness: Darryl Kazmierski. is a 83 y.o. male who presents for a follow-up visit regarding chronic atrial fibrillation. He has multiple chronic medical conditions that include type 2 diabetes, hypertension, and hyperlipidemia. Echocardiogram in 2016 showed an ejection fraction of 50-55% with mild pulmonary hypertension.  His functional capacity has been declining gradually.   He denies chest pain or palpitations. He continues to have exertional dyspnea and he appears to be deconditioned. He is tolerating anticoagulation with Eliquis. The price went up as he is now in " the donut hole" .   Past Medical History:  Diagnosis Date  . BENIGN PROSTATIC HYPERTROPHY 11/25/2007  . DIABETES MELLITUS, TYPE II 11/10/2006  . HYPERLIPIDEMIA 11/10/2006  . HYPERTENSION 12/13/2008  . OSTEOARTHRITIS 11/10/2006  . PAF (paroxysmal atrial fibrillation) (HCC)    a. on eliquis  . Pulmonary HTN (HCC)    a. echo 01/2015: EF EF 50-55%, normal wall motion, GR1DD, mild biatrial enlargement, PASP 45 mm Hg  . PVD (peripheral vascular disease) (HCC)   . Rheumatic fever   . Swelling of lower extremity     Past Surgical History:  Procedure Laterality Date  . CATARACT EXTRACTION    . mastoid surgery    . TENDON REPAIR     left foot     Current Outpatient Prescriptions  Medication Sig Dispense Refill  . ACETAMINOPHEN PO Take by mouth as needed. Reported on 06/13/2015    . apixaban (ELIQUIS) 2.5 MG TABS tablet Take 1 tablet (2.5 mg total) by mouth 2 (two) times daily. 60 tablet 3  . benazepril (LOTENSIN) 10 MG tablet Take 1 tablet (10 mg total) by mouth daily. 90 tablet 3  . donepezil  (ARICEPT) 5 MG tablet Take 1 tablet (5 mg total) by mouth at bedtime. 90 tablet 3  . metFORMIN (GLUCOPHAGE) 1000 MG tablet Take 0.5 tablets (500 mg total) by mouth daily with breakfast. 90 tablet 3  . metoprolol (LOPRESSOR) 50 MG tablet Take 0.5 tablets (25 mg total) by mouth 2 (two) times daily. 180 tablet 3  . pravastatin (PRAVACHOL) 20 MG tablet Take 1 tablet (20 mg total) by mouth at bedtime. 90 tablet 1  . tamsulosin (FLOMAX) 0.4 MG CAPS capsule Take 1 capsule (0.4 mg total) by mouth daily. 30 capsule 3  . TRAVATAN Z 0.004 % SOLN ophthalmic solution Place 1 drop into both eyes at bedtime.      No current facility-administered medications for this visit.     Allergies:   Tramadol    Social History:  The patient  reports that he has never smoked. He has never used smokeless tobacco. He reports that he drinks alcohol. He reports that he does not use drugs.   Family History:  The patient's family history includes Cancer in his father; Heart disease in his mother.    ROS:  Please see the history of present illness.   Otherwise, review of systems are positive for none.   All other systems are reviewed and negative.    PHYSICAL EXAM: VS:  BP 120/64 (BP Location: Right Arm, Patient Position: Sitting, Cuff Size: Normal)  Pulse 91   Ht 6' (1.829 m)   Wt 164 lb 12 oz (74.7 kg)   BMI 22.34 kg/m  , BMI Body mass index is 22.34 kg/m. GEN: Well nourished, well developed, in no acute distress  HEENT: normal  Neck: no JVD, carotid bruits, or masses Cardiac: Irregularly irregular; no murmurs, rubs, or gallops. mild bilateral leg edema mostly nonpitting. Respiratory:  clear to auscultation bilaterally, normal work of breathing GI: soft, nontender, nondistended, + BS MS: no deformity or atrophy  Skin: warm and dry, no rash Neuro:  Strength and sensation are intact Psych: euthymic mood, full affect   EKG:  EKG is ordered today. The ekg ordered today demonstrates atrial fibrillation with  nonspecific ST changes.   Recent Labs: 08/13/2016: ALT 2; Hemoglobin 11.1; Platelets 399.0; TSH 3.87 09/13/2016: BUN 21; Creatinine, Ser 1.53; Potassium 4.5; Sodium 136    Lipid Panel    Component Value Date/Time   CHOL 150 08/13/2016 1106   TRIG 184.0 (H) 08/13/2016 1106   HDL 43.80 08/13/2016 1106   CHOLHDL 3 08/13/2016 1106   VLDL 36.8 08/13/2016 1106   LDLCALC 70 08/13/2016 1106   LDLDIRECT 135.4 11/10/2006 0000      Wt Readings from Last 3 Encounters:  02/17/17 164 lb 12 oz (74.7 kg)  12/24/16 169 lb 6.4 oz (76.8 kg)  09/13/16 174 lb 6.4 oz (79.1 kg)         ASSESSMENT AND PLAN:  1.  Chronic atrial fibrillation:  ventricular rate is controlled on current dose of metoprolol. He is tolerating low dose Eliquis for anticoagulation. I reviewed his labs from April and May which showed stable renal function with a creatinine of 1.53 and hemoglobin of 11.1. Continue same medications. We are going to try to help him with the patient's assistance program for Eliquis.  2. Essential hypertension: The pressure is well controlled on current medications.  3. Chronic venous insufficiency: Stable leg edema.    Disposition:   FU with me in 6 months  Signed,  Lorine Bears, MD  02/17/2017 1:31 PM    [facility]

## 2017-02-17 NOTE — Patient Instructions (Signed)
Medication Instructions: Continue same medications.   Labwork: None.   Procedures/Testing: None.   Follow-Up: 6 months with Dr. Arida.   Any Additional Special Instructions Will Be Listed Below (If Applicable).     If you need a refill on your cardiac medications before your next appointment, please call your pharmacy.   

## 2017-02-18 ENCOUNTER — Telehealth: Payer: Self-pay | Admitting: Cardiovascular Disease

## 2017-02-18 NOTE — Telephone Encounter (Signed)
Pt and wife completed Eliquis patient assistance form with [employer] and [facility] Faxed to [employer], [phone]

## 2017-02-20 ENCOUNTER — Telehealth: Payer: Self-pay | Admitting: Cardiovascular Disease

## 2017-02-20 NOTE — Telephone Encounter (Signed)
Per patient request, I have faxed Medication Management application to [phone]

## 2017-03-10 ENCOUNTER — Telehealth: Payer: Self-pay | Admitting: Internal Medicine

## 2017-03-10 DIAGNOSIS — I10 Essential (primary) hypertension: Secondary | ICD-10-CM

## 2017-03-10 NOTE — Telephone Encounter (Signed)
Darryl Leonard, agree with hospice referral

## 2017-03-10 NOTE — Telephone Encounter (Signed)
Jen @ Hospice called to advise that [place] in [place] so Hospice referral will need to be placed with them.   Paty - FYI. Thanks!

## 2017-03-10 NOTE — Telephone Encounter (Signed)
Wife states pt really needs to be referred to hospice again. Back in June pt declined, but wife states the SOB has increased so much, causing concern, and pt spits up a lot of mucus.  Pt does not walk very far, maybe around the kitchen table, and just going to the bathroom is a chore.  Wife would also like a call back to advise what to do when he gets really short of breath.

## 2017-03-10 NOTE — Telephone Encounter (Signed)
I have set up Hospice Consult, spoke with Genn and gave her the pts information.    Please see message below, please advise.

## 2017-03-10 NOTE — Telephone Encounter (Signed)
Sent another referral to Hospice & palliative care center.

## 2017-03-12 ENCOUNTER — Telehealth: Payer: Self-pay | Admitting: Internal Medicine

## 2017-03-12 DIAGNOSIS — R69 Illness, unspecified: Secondary | ICD-10-CM | POA: Diagnosis not present

## 2017-03-12 MED ORDER — IPRATROPIUM-ALBUTEROL 0.5-2.5 (3) MG/3ML IN SOLN: 3.0000 mL | RESPIRATORY_TRACT | 0 refills | Status: AC | PRN

## 2017-03-12 NOTE — Telephone Encounter (Signed)
Hospice has ordered nebulizer, they would like order for DuoNeb.   Dr. Clent RidgesFry - Would you be able to approve this order? Thanks!

## 2017-03-12 NOTE — Telephone Encounter (Signed)
This was sent in  

## 2017-03-12 NOTE — Telephone Encounter (Signed)
Pt admitted to hospice today and when the nurse got there, pt is VERY short of breathe.  Hospice feelspt would benefit by   Duoneb for the nebulizer.  They would like to see if the dr could order   Hospice has ordered O2,  [id number] - [place] ([address] [place] - [address]  Hospice aware Dr Kirtland BouchardK is out this afternoon, but hopes another dr can order.

## 2017-03-14 ENCOUNTER — Telehealth: Payer: Self-pay | Admitting: Internal Medicine

## 2017-03-14 NOTE — Telephone Encounter (Signed)
° ° ° °  Crystal with Hospice at [facility] call to ask if Dr Raliegh Ip wanted to include Morphine in the comfort kit.    [phone]

## 2017-03-17 ENCOUNTER — Other Ambulatory Visit: Payer: Self-pay | Admitting: Internal Medicine

## 2017-03-17 ENCOUNTER — Telehealth: Payer: Self-pay | Admitting: Internal Medicine

## 2017-03-17 MED ORDER — TEMAZEPAM 7.5 MG PO CAPS: 7.5000 mg | ORAL_CAPSULE | Freq: Every evening | ORAL | 0 refills | Status: AC | PRN

## 2017-03-17 MED ORDER — MORPHINE SULFATE (CONCENTRATE) 10 MG /0.5 ML PO SOLN: 10.0000 mg | ORAL | 0 refills | Status: AC | PRN

## 2017-03-17 NOTE — Telephone Encounter (Signed)
Rx's were faxed, voicemail was left to inform Tia,RN from hospice that rx were faxed to pharmacy.

## 2017-03-17 NOTE — Telephone Encounter (Signed)
Please advise 

## 2017-03-17 NOTE — Telephone Encounter (Signed)
° ° °  Tina with Hospice call to say pt is having a lot of SOB and she is asking if Dr Kirtland BouchardK would be willing to call pt in some liquid morphine for activity. They are also asking for something to help him sleep like tomazapam    Can fax rx in just need to write Hospice pt on the rx    [facility] [address] [place]

## 2017-03-17 NOTE — Telephone Encounter (Signed)
Okay if that is hospice protocol

## 2017-03-17 NOTE — Telephone Encounter (Signed)
Okay to fax

## 2017-03-17 NOTE — Telephone Encounter (Signed)
Crystal,RN from Hospice states that Morphine is usually added in the comfort kit so that when the medication is need for comfort care it is available   Pleases advise

## 2017-03-17 NOTE — Telephone Encounter (Signed)
Will defer to the judgment of the hospice treating physician

## 2017-03-18 NOTE — Telephone Encounter (Signed)
Called and confirmed that she listened to my voicemail.

## 2017-03-18 NOTE — Telephone Encounter (Signed)
Crystal made aware.

## 2017-03-21 ENCOUNTER — Telehealth: Payer: Self-pay

## 2017-03-21 NOTE — Telephone Encounter (Signed)
Dr. Kirtland BouchardK - Lorain ChildesFYI. Thanks.   [facility] Client  [facility] Night - Client Client Site  [facility] - Night Physician Derryl HarborKwiatkowski, Pete - MD Contact Type Call Call Type Home Care Hospice Page Now Who Is Calling Home Health / [facility] Agency [facility] Facility Number [phone] Patient Name Darryl HouseholderWilliam J Dooling Leonard Patient DOB Jan 01, 1924 Reason for Call Death Notification Initial Comment [facility] [place] [phone] There has been a death Darryl HouseholderWilliam J Altland Leonard - Jan 01, 1924 pronounced 2016/07/06 @ 7:15pm

## 2017-03-26 ENCOUNTER — Ambulatory Visit: Payer: Medicare HMO | Admitting: Internal Medicine

## 2017-03-26 ENCOUNTER — Telehealth: Payer: Self-pay | Admitting: *Deleted

## 2017-03-26 NOTE — Telephone Encounter (Signed)
Caller name: April w/ [employer] Can be reached: [phone]   Reason for call: April is needing death certificate completed and faxed to [phone] ASAP. Death certificate was faxed to our office yesterday. The family wants patient cremated before his funeral on Friday.

## 2017-03-27 NOTE — Telephone Encounter (Signed)
Death Certificate was faxed yesterday 03/26/17

## 2017-04-12 DEATH — deceased
